# Patient Record
Sex: Male | Born: 1990 | Race: Black or African American | Hispanic: No | Marital: Single | Smoking: Current some day smoker
Health system: Southern US, Community
[De-identification: ages and names within clinical notes are randomized; demographics above are authoritative.]

## PROBLEM LIST (undated history)

## (undated) DIAGNOSIS — J45909 Unspecified asthma, uncomplicated: Secondary | ICD-10-CM

## (undated) DIAGNOSIS — F4310 Post-traumatic stress disorder, unspecified: Secondary | ICD-10-CM

## (undated) DIAGNOSIS — F209 Schizophrenia, unspecified: Secondary | ICD-10-CM

## (undated) DIAGNOSIS — R443 Hallucinations, unspecified: Secondary | ICD-10-CM

---
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
## 2011-12-18 ENCOUNTER — Encounter (HOSPITAL_COMMUNITY): Payer: Self-pay | Admitting: Emergency Medicine

## 2011-12-18 ENCOUNTER — Emergency Department (HOSPITAL_COMMUNITY)
Admission: EM | Admit: 2011-12-18 | Discharge: 2011-12-18 | Disposition: A | Discharge status: Home / Self Care | Payer: No Typology Code available for payment source | Attending: Emergency Medicine | Admitting: Emergency Medicine

## 2011-12-18 ENCOUNTER — Emergency Department (HOSPITAL_COMMUNITY): Payer: No Typology Code available for payment source

## 2011-12-18 DIAGNOSIS — T07XXXA Unspecified multiple injuries, initial encounter: Secondary | ICD-10-CM

## 2011-12-18 DIAGNOSIS — M549 Dorsalgia, unspecified: Secondary | ICD-10-CM | POA: Insufficient documentation

## 2011-12-18 DIAGNOSIS — M542 Cervicalgia: Secondary | ICD-10-CM | POA: Insufficient documentation

## 2011-12-18 LAB — DIFFERENTIAL
Band Neutrophils: 0 % (ref 0–10)
Blasts: 0 %
Eosinophils Absolute: 0.4 10*3/uL (ref 0.0–0.7)
Eosinophils Relative: 3 % (ref 0–5)
Lymphocytes Relative: 31 % (ref 12–46)
Lymphs Abs: 4 10*3/uL (ref 0.7–4.0)
Metamyelocytes Relative: 0 %
Monocytes Absolute: 0.4 10*3/uL (ref 0.1–1.0)
Monocytes Relative: 3 % (ref 3–12)
nRBC: 0 /100 WBC

## 2011-12-18 LAB — COMPREHENSIVE METABOLIC PANEL
Albumin: 4.5 g/dL (ref 3.5–5.2)
Alkaline Phosphatase: 73 U/L (ref 39–117)
BUN: 18 mg/dL (ref 6–23)
Calcium: 10.2 mg/dL (ref 8.4–10.5)
GFR calc Af Amer: >90 mL/min (ref >90)
Glucose, Bld: 89 mg/dL (ref 70–99)
Potassium: 3.2 mEq/L — ABNORMAL LOW (ref 3.5–5.1)
Total Protein: 8 g/dL (ref 6.0–8.3)

## 2011-12-18 LAB — CBC
HCT: 44.9 % (ref 39.0–52.0)
MCV: 85 fL (ref 78.0–100.0)
Platelets: 204 10*3/uL (ref 150–400)
RBC: 5.28 MIL/uL (ref 4.22–5.81)
RDW: 15.4 % (ref 11.5–15.5)
WBC: 13 10*3/uL — ABNORMAL HIGH (ref 4.0–10.5)

## 2011-12-18 MED ORDER — ONDANSETRON HCL 4 MG/2ML IJ SOLN
4.0000 mg | Freq: Once | INTRAMUSCULAR | Status: AC
  Administered 2011-12-18: 4 mg via INTRAVENOUS
  Filled 2011-12-18: qty 2

## 2011-12-18 MED ORDER — HYDROCODONE-ACETAMINOPHEN 5-500 MG PO TABS: 1.0000 | ORAL_TABLET | Freq: Four times a day (QID) | ORAL | Status: AC | PRN

## 2011-12-18 MED ORDER — SODIUM CHLORIDE 0.9 % IV SOLN
INTRAVENOUS | Status: AC | PRN
  Administered 2011-12-18: 500 mL/h via INTRAVENOUS

## 2011-12-18 MED ORDER — HYDROMORPHONE HCL PF 1 MG/ML IJ SOLN
1.0000 mg | Freq: Once | INTRAMUSCULAR | Status: AC
  Administered 2011-12-18: 1 mg via INTRAVENOUS
  Filled 2011-12-18: qty 1
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
## 2011-12-18 NOTE — ED Notes (Signed)
Drinks and snacks offered to pt, pt tolerated it well.
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
## 2011-12-18 NOTE — Discharge Instructions (Signed)
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
## 2016-08-31 ENCOUNTER — Emergency Department (HOSPITAL_COMMUNITY)
Admission: EM | Admit: 2016-08-31 | Discharge: 2016-09-03 | Disposition: A | Discharge status: Home / Self Care | Payer: Self-pay | Attending: Emergency Medicine | Admitting: Emergency Medicine

## 2016-08-31 ENCOUNTER — Encounter (HOSPITAL_COMMUNITY): Payer: Self-pay | Admitting: Emergency Medicine

## 2016-08-31 DIAGNOSIS — F29 Unspecified psychosis not due to a substance or known physiological condition: Secondary | ICD-10-CM | POA: Insufficient documentation

## 2016-08-31 DIAGNOSIS — Z79899 Other long term (current) drug therapy: Secondary | ICD-10-CM | POA: Insufficient documentation

## 2016-08-31 DIAGNOSIS — F17200 Nicotine dependence, unspecified, uncomplicated: Secondary | ICD-10-CM | POA: Insufficient documentation

## 2016-08-31 DIAGNOSIS — F203 Undifferentiated schizophrenia: Secondary | ICD-10-CM | POA: Diagnosis present

## 2016-08-31 HISTORY — DX: Post-traumatic stress disorder, unspecified: F43.10

## 2016-08-31 HISTORY — DX: Schizophrenia, unspecified: F20.9

## 2016-08-31 HISTORY — DX: Hallucinations, unspecified: R44.3

## 2016-08-31 LAB — CBC
HCT: 43.3 % (ref 39.0–52.0)
Hemoglobin: 14.8 g/dL (ref 13.0–17.0)
MCH: 28.6 pg (ref 26.0–34.0)
MCHC: 34.2 g/dL (ref 30.0–36.0)
MCV: 83.8 fL (ref 78.0–100.0)
PLATELETS: 207 10*3/uL (ref 150–400)
RBC: 5.17 MIL/uL (ref 4.22–5.81)
RDW: 14.2 % (ref 11.5–15.5)
WBC: 8.1 10*3/uL (ref 4.0–10.5)

## 2016-08-31 LAB — COMPREHENSIVE METABOLIC PANEL
ALT: 16 U/L — ABNORMAL LOW (ref 17–63)
ANION GAP: 8 (ref 5–15)
AST: 19 U/L (ref 15–41)
Albumin: 4.8 g/dL (ref 3.5–5.0)
Alkaline Phosphatase: 82 U/L (ref 38–126)
BILIRUBIN TOTAL: 0.5 mg/dL (ref 0.3–1.2)
BUN: 14 mg/dL (ref 6–20)
CALCIUM: 9.9 mg/dL (ref 8.9–10.3)
CO2: 26 mmol/L (ref 22–32)
Chloride: 104 mmol/L (ref 101–111)
Creatinine, Ser: 0.86 mg/dL (ref 0.61–1.24)
GFR calc Af Amer: >60 mL/min (ref >60)
Glucose, Bld: 132 mg/dL — ABNORMAL HIGH (ref 65–99)
Potassium: 3.9 mmol/L (ref 3.5–5.1)
Sodium: 138 mmol/L (ref 135–145)
TOTAL PROTEIN: 8.3 g/dL — AB (ref 6.5–8.1)

## 2016-08-31 LAB — RAPID URINE DRUG SCREEN, HOSP PERFORMED
Amphetamines: NOT DETECTED
BENZODIAZEPINES: NOT DETECTED
Barbiturates: NOT DETECTED
Cocaine: NOT DETECTED
Opiates: NOT DETECTED
Tetrahydrocannabinol: NOT DETECTED

## 2016-08-31 LAB — SALICYLATE LEVEL: Salicylate Lvl: <7 mg/dL (ref 2.8–30.0)

## 2016-08-31 LAB — ETHANOL

## 2016-08-31 LAB — ACETAMINOPHEN LEVEL

## 2016-08-31 MED ORDER — ACETAMINOPHEN 325 MG PO TABS: 650.0000 mg | ORAL_TABLET | ORAL | Status: DC | PRN

## 2016-08-31 MED ORDER — ZIPRASIDONE MESYLATE 20 MG IM SOLR: 20.0000 mg | Freq: Four times a day (QID) | INTRAMUSCULAR | Status: DC | PRN

## 2016-08-31 MED ORDER — ONDANSETRON 4 MG PO TBDP: 4.0000 mg | ORAL_TABLET | Freq: Three times a day (TID) | ORAL | Status: DC | PRN

## 2016-08-31 NOTE — ED Notes (Signed)
Vitals delayed due to patient being interrogated by [employer] at this time

## 2016-08-31 NOTE — ED Notes (Signed)
Bed: WA30 Expected date:  Expected time:  Means of arrival:  Comments: [employer]-IVC 

## 2016-08-31 NOTE — ED Notes (Signed)
Hourly rounding reveals patient in shower room. No complaints, stable, in no acute distress. Q15 minute rounds and monitoring via Security Cameras to continue.  

## 2016-08-31 NOTE — ED Provider Notes (Signed)
[facility]-EMERGENCY DEPT Provider Note   CSN: [id number] Arrival date & time: 08/31/16  1710     History   Chief Complaint Chief Complaint  Patient presents with  . IVC  . Delusional    HPI Curtis Davenport is a 26 y.o. male. Complaint is patient under IVC.  HPI:  26 year old male with history of schizophrenia and PTSD. Was at his counselors appointment today. Has been off his medications. Was delusional and psychotic. Displaced in her IVC by his counselor. Was brought here by [employer]. He was uncooperative. He had to be tazed there. This was without incident. He did not fall.  History of March are removed without difficulty.  Is not able to tell me much with history. He does not recall being his counselor's office. He states that [place] is [place]. He states that he takes medications to help him tolerate our atmosphere.  Past Medical History:  Diagnosis Date  . Hallucinations   . PTSD (post-traumatic stress disorder)   . Schizophrenia (HCC)     There are no active problems to display for this patient.   History reviewed. No pertinent surgical history.     Home Medications    Prior to Admission medications   Medication Sig Start Date End Date Taking? Authorizing Provider  albuterol (PROVENTIL HFA;VENTOLIN HFA) 108 (90 BASE) MCG/ACT inhaler Inhale 2 puffs into the lungs every 6 (six) hours as needed. For shortness of breath    Historical Provider, MD    Family History No family history on file.  Social History Social History  Substance Use Topics  . Smoking status: Current Some Day Smoker  . Smokeless tobacco: Never Used  . Alcohol use Yes     Allergies   Shellfish allergy   Review of Systems Review of Systems  Unable to perform ROS: Psychiatric disorder     Physical Exam Updated Vital Signs BP 139/81 (BP Location: Left Arm)   Pulse 103   Temp 99.6 F (37.6 C) (Oral)   Resp 22   SpO2 97%   Physical Exam  Constitutional: He is oriented to person,  place, and time. He appears well-developed and well-nourished. No distress.  HENT:  Head: Normocephalic.  Eyes: Conjunctivae are normal. Pupils are equal, round, and reactive to light. No scleral icterus.  Neck: Normal range of motion. Neck supple. No thyromegaly present.  Cardiovascular: Normal rate and regular rhythm.  Exam reveals no gallop and no friction rub.   No murmur heard. Pulmonary/Chest: Effort normal and breath sounds normal. No respiratory distress. He has no wheezes. He has no rales.  Abdominal: Soft. Bowel sounds are normal. He exhibits no distension. There is no tenderness. There is no rebound.  Musculoskeletal: Normal range of motion.  Neurological: He is alert and oriented to person, place, and time.  Skin: Skin is warm and dry. No rash noted.  Psychiatric: He has a normal mood and affect. His speech is tangential. He is hyperactive. Thought content is delusional.  Currently calm. Tells me that he is a "super hero and part of the  [employer], but "not really a super hero".     ED Treatments / Results  Labs (all labs ordered are listed, but only abnormal results are displayed) Labs Reviewed  COMPREHENSIVE METABOLIC PANEL - Abnormal; Notable for the following:       Result Value   Glucose, Bld 132 (*)    Total Protein 8.3 (*)    ALT 16 (*)    All other components  within normal limits  ACETAMINOPHEN LEVEL - Abnormal; Notable for the following:    Acetaminophen (Tylenol), Serum <10 (*)    All other components within normal limits  ETHANOL  SALICYLATE LEVEL  CBC  RAPID URINE DRUG SCREEN, HOSP PERFORMED    EKG  EKG Interpretation None       Radiology No results found.  Procedures Procedures (including critical care time)  Medications Ordered in ED Medications - No data to display   Initial Impression / Assessment and Plan / ED Course  I have reviewed the triage vital signs and the nursing notes.  Pertinent labs & imaging results that were  available during my care of the patient were reviewed by me and considered in my medical decision making (see chart for details).     Patient is not able to offer any insight into his condition. He is unable to talk about schizophrenia, medications. Her the importance of taking medications. When I ask him what he would do if he would leave he states he will "I guess just walk down the road". He IVC. I have written first exam. Patient is medically clear based on labs here. Awaiting TTS evaluation. Will likely require inpatient psychiatric care.  Final Clinical Impressions(s) / ED Diagnoses   Final diagnoses:  Psychosis, unspecified psychosis type    New Prescriptions New Prescriptions   No medications on file     Rolland Porter, MD 08/31/16 1943

## 2016-08-31 NOTE — ED Notes (Signed)
Requested PRN's from MD [facility]

## 2016-08-31 NOTE — ED Notes (Signed)
Pt asleep at this time in no distress.  Equal chest expansion with non-labored breathing.

## 2016-08-31 NOTE — ED Notes (Signed)
[facility] Expected date:  Expected time:  Means of arrival:  Comments: Hold for rm 30

## 2016-08-31 NOTE — ED Triage Notes (Signed)
Pt was picked up from his therapist's office after being IVC'd by her.  Pt reportedly has been noncompliant with his schizophrenia medications and believes he is part of the "[employer]" and thinks his therapist is part of the [employer].  States he thinks he is from another [place].  Pt was non-cooperative when picked up by [employer].

## 2016-08-31 NOTE — ED Notes (Signed)
Hourly rounding reveals patient sleeping in room. No complaints, stable, in no acute distress. Q15 minute rounds and monitoring via Security Cameras to continue. 

## 2016-08-31 NOTE — ED Notes (Signed)
Pt. Transferred to [facility] from ED to room 34 after screening for contraband. Report to include Situation, Background, Assessment and Recommendations from [facility]. Pt. Oriented to unit including Q15 minute rounds as well as the security cameras for their protection. Patient is alert and oriented, warm and dry in no acute distress. Patient denies SI, HI, and VH. Pt. States he hears voices without command. Pt. Encouraged to let me know if needs arise.
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
## 2016-09-01 ENCOUNTER — Encounter (HOSPITAL_COMMUNITY): Payer: Self-pay | Admitting: Registered Nurse

## 2016-09-01 DIAGNOSIS — F17210 Nicotine dependence, cigarettes, uncomplicated: Secondary | ICD-10-CM

## 2016-09-01 DIAGNOSIS — F29 Unspecified psychosis not due to a substance or known physiological condition: Secondary | ICD-10-CM

## 2016-09-01 DIAGNOSIS — Z91013 Allergy to seafood: Secondary | ICD-10-CM

## 2016-09-01 DIAGNOSIS — F203 Undifferentiated schizophrenia: Secondary | ICD-10-CM | POA: Diagnosis present

## 2016-09-01 DIAGNOSIS — Z79899 Other long term (current) drug therapy: Secondary | ICD-10-CM

## 2016-09-01 NOTE — ED Notes (Signed)
Hourly rounding reveals patient sleeping in room. No complaints, stable, in no acute distress. Q15 minute rounds and monitoring via Security Cameras to continue. 

## 2016-09-01 NOTE — ED Notes (Signed)
This pt appears to be responding to internal stimuli.  He is walking around and laughing out loud.  He also is religiously preoccupied .  He denies S/I and H/I.  15 minute checks and video monitoring continue.

## 2016-09-01 NOTE — ED Notes (Signed)
Report to include situation, background, assessment and recommendations from Edie RN. Patient sleeping, respirations regular and unlabored. Q15 minute rounds and security camera observation to continue.   

## 2016-09-01 NOTE — ED Notes (Signed)
Hourly rounding reveals patient in room. No complaints, stable, in no acute distress. Q15 minute rounds and monitoring via Security Cameras to continue. 

## 2016-09-01 NOTE — Consult Note (Signed)
[facility] Consult   Reason for Consult:  Psychosis Referring Physician:  EDP Patient Identification: Curtis Davenport MRN:  [id number] Principal Diagnosis: Schizophrenia [facility]) Diagnosis:   Patient Active Problem List   Diagnosis Date Noted  . Schizophrenia (Curtis Davenport) [F20.9]     Total Time spent with patient: 45 minutes  Subjective:   Curtis Davenport is a 26 y.o. male patient presented to [facility] under IVC by his therapist states that patient believes he is part of the [employer]  HPI:  Curtis Davenport 26 y.o. male patient seen by Dr. Louretta Shorten and this provider.  Chart reviewed 09/01/16.   On evaluation:  Curtis Davenport reports that he does not know why he is in the hospital.  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  Patient also denies psych history.     Past Psychiatric History: Schizophrenia   Risk to Self: Suicidal Ideation: No Suicidal Intent: No Is patient at risk for suicide?: No Suicidal Plan?: No Access to Means: No What has been your use of drugs/alcohol within the last 12 months?: UTA Triggers for Past Attempts: None known Intentional Self Injurious Behavior: None Risk to Others: Homicidal Ideation: No Thoughts of Harm to Others: No Current Homicidal Intent: No Current Homicidal Plan: No Access to Homicidal Means: No History of harm to others?: No Assessment of Violence: On admission Violent Behavior Description: per chart pt was uncooperative with [employer] and had to be tased  Does patient have access to weapons?:  (pt stated "I don't know") Criminal Charges Pending?:  (UTA) Does patient have a court date:  (UTA) Prior Inpatient Therapy: Prior Inpatient Therapy: Yes Prior Therapy Dates: unknown, per IVC pt has been inpt. pt denies  Prior Therapy Facilty/Provider(s): unknown Reason for Treatment: unknown Prior Outpatient Therapy: Prior Outpatient Therapy: Yes Prior Therapy  Dates: current  Prior Therapy Facilty/Provider(s): [facility] Reason for Treatment: schizophrenia Does patient have an ACCT team?: Unknown Does patient have Intensive In-House Services?  : Unknown Does patient have [facility] services? : Unknown Does patient have P4CC services?: Unknown  Past Medical History:  Past Medical History:  Diagnosis Date  . Hallucinations   . PTSD (post-traumatic stress disorder)   . Schizophrenia (Curtis Davenport)    History reviewed. No pertinent surgical history. Family History: History reviewed. No pertinent family history. Family Psychiatric  History: Denies Social History:  History  Alcohol Use  . Yes     History  Drug Use No    Social History   Social History  . Marital status: Single    Spouse name: N/A  . Number of children: N/A  . Years of education: N/A   Social History Main Topics  . Smoking status: Current Some Day Smoker  . Smokeless tobacco: Never Used  . Alcohol use Yes  . Drug use: No  . Sexual activity: Yes   Other Topics Concern  . None   Social History Narrative  . None   Additional Social History:    Allergies:   Allergies  Allergen Reactions  . Shellfish Allergy Swelling    Labs:  Results for orders placed or performed during the hospital encounter of 08/31/16 (from the past 48 hour(s))  Rapid urine drug screen (hospital performed)     Status: None   Collection Time: 08/31/16  5:30 PM  Result Value Ref Range   Opiates NONE DETECTED NONE DETECTED   Cocaine NONE DETECTED NONE DETECTED  Benzodiazepines NONE DETECTED NONE DETECTED   Amphetamines NONE DETECTED NONE DETECTED   Tetrahydrocannabinol NONE DETECTED NONE DETECTED   Barbiturates NONE DETECTED NONE DETECTED    Comment:        DRUG SCREEN FOR MEDICAL PURPOSES ONLY.  IF CONFIRMATION IS NEEDED FOR ANY PURPOSE, NOTIFY LAB WITHIN 5 DAYS.        LOWEST DETECTABLE LIMITS FOR URINE DRUG SCREEN Drug Class       Cutoff (ng/mL) Amphetamine      1000 Barbiturate       200 Benzodiazepine   124 Tricyclics       580 Opiates          300 Cocaine          300 THC              50   Comprehensive metabolic panel     Status: Abnormal   Collection Time: 08/31/16  5:31 PM  Result Value Ref Range   Sodium 138 135 - 145 mmol/L   Potassium 3.9 3.5 - 5.1 mmol/L   Chloride 104 101 - 111 mmol/L   CO2 26 22 - 32 mmol/L   Glucose, Bld 132 (H) 65 - 99 mg/dL   BUN 14 6 - 20 mg/dL   Creatinine, Ser 0.86 0.61 - 1.24 mg/dL   Calcium 9.9 8.9 - 10.3 mg/dL   Total Protein 8.3 (H) 6.5 - 8.1 g/dL   Albumin 4.8 3.5 - 5.0 g/dL   AST 19 15 - 41 U/L   ALT 16 (L) 17 - 63 U/L   Alkaline Phosphatase 82 38 - 126 U/L   Total Bilirubin 0.5 0.3 - 1.2 mg/dL   GFR calc non Af Amer >60 >60 mL/min   GFR calc Af Amer >60 >60 mL/min    Comment: (NOTE) The eGFR has been calculated using the CKD EPI equation. This calculation has not been validated in all clinical situations. eGFR's persistently <60 mL/min signify possible Chronic Kidney Disease.    Anion gap 8 5 - 15  Ethanol     Status: None   Collection Time: 08/31/16  5:31 PM  Result Value Ref Range   Alcohol, Ethyl (B) <5 <5 mg/dL    Comment:        LOWEST DETECTABLE LIMIT FOR SERUM ALCOHOL IS 5 mg/dL FOR MEDICAL PURPOSES ONLY   Salicylate level     Status: None   Collection Time: 08/31/16  5:31 PM  Result Value Ref Range   Salicylate Lvl <9.9 2.8 - 30.0 mg/dL  Acetaminophen level     Status: Abnormal   Collection Time: 08/31/16  5:31 PM  Result Value Ref Range   Acetaminophen (Tylenol), Serum <10 (L) 10 - 30 ug/mL    Comment:        THERAPEUTIC CONCENTRATIONS VARY SIGNIFICANTLY. A RANGE OF 10-30 ug/mL MAY BE AN EFFECTIVE CONCENTRATION FOR MANY PATIENTS. HOWEVER, SOME ARE BEST TREATED AT CONCENTRATIONS OUTSIDE THIS RANGE. ACETAMINOPHEN CONCENTRATIONS >150 ug/mL AT 4 HOURS AFTER INGESTION AND >50 ug/mL AT 12 HOURS AFTER INGESTION ARE OFTEN ASSOCIATED WITH TOXIC REACTIONS.   cbc     Status: None    Collection Time: 08/31/16  5:31 PM  Result Value Ref Range   WBC 8.1 4.0 - 10.5 K/uL   RBC 5.17 4.22 - 5.81 MIL/uL   Hemoglobin 14.8 13.0 - 17.0 g/dL   HCT 43.3 39.0 - 52.0 %   MCV 83.8 78.0 - 100.0 fL   MCH 28.6 26.0 - 34.0 pg  MCHC 34.2 30.0 - 36.0 g/dL   RDW 14.2 11.5 - 15.5 %   Platelets 207 150 - 400 K/uL    Current Facility-Administered Medications  Medication Dose Route Frequency Provider Last Rate Last Dose  . acetaminophen (TYLENOL) tablet 650 mg  650 mg Oral Q4H PRN Tanna Furry, MD      . ondansetron (ZOFRAN-ODT) disintegrating tablet 4 mg  4 mg Oral Q8H PRN Tanna Furry, MD      . ziprasidone (GEODON) injection 20 mg  20 mg Intramuscular Q6H PRN Tanna Furry, MD       Current Outpatient Prescriptions  Medication Sig Dispense Refill  . albuterol (PROVENTIL HFA;VENTOLIN HFA) 108 (90 BASE) MCG/ACT inhaler Inhale 2 puffs into the lungs every 6 (six) hours as needed. For shortness of breath      Musculoskeletal: Strength & Muscle Tone: within normal limits Gait & Station: normal Patient leans: N/A  Psychiatric Specialty Exam: Physical Exam  Nursing note and vitals reviewed. Constitutional: He is oriented to person, place, and time.  Neck: Normal range of motion.  Respiratory: Effort normal.  Musculoskeletal: Normal range of motion.  Neurological: He is alert and oriented to person, place, and time.    Review of Systems  Psychiatric/Behavioral: Positive for depression.    Blood pressure 121/71, pulse 60, temperature 98.8 F (37.1 C), temperature source Oral, resp. rate 18, SpO2 100 %.There is no height or weight on file to calculate BMI.  General Appearance: Disheveled and Odorous  Eye Contact:  Good  Speech:  Clear and Coherent and Normal Rate  Volume:  Normal  Mood:  Anxious  Affect:  Labile  Thought Process:  Disorganized  Orientation:  Full (Time, Place, and Person)  Thought Content:  Delusions  Suicidal Thoughts:  No  Homicidal Thoughts:  No  Memory:   Immediate;   Poor Recent;   Poor Remote;   Poor  Judgement:  Impaired  Insight:  Lacking  Psychomotor Activity:  Normal  Concentration:  Concentration: Poor and Attention Span: Poor  Recall:  Poor  Fund of Knowledge:  Fair  Language:  Good  Akathisia:  No  Handed:  Right  AIMS (if indicated):     Assets:  Communication Skills  ADL's:  Intact  Cognition:  WNL  Sleep:        Treatment Plan Summary: Daily contact with patient to assess and evaluate symptoms and progress in treatment and Medication management  Medication: Geodon 20 mg intramuscular every 6 hours when necessary - maximum doses 20 mg a day  Disposition: Recommend psychiatric Inpatient admission when medically cleared.  Rankin, Delphia Grates, NP 09/01/2016 5:41 PM   Patient seen with the physician next and for this evacuation and case discussed with the treatment team and formulated treatment plan. Reviewed the information documented and agree with the treatment plan.   [facility] 09/06/2016 6:55 PM

## 2016-09-01 NOTE — BHH Counselor (Signed)
Pt has been faxed to the following inpt facilities: [facility], [facility], [facility], [facility], [facility], [facility], [facility], [address], [facility].  Princess BruinsAquicha Duff, MSW, Theresia MajorsLCSWA

## 2016-09-01 NOTE — ED Notes (Signed)
Pt "politely refused" vital signs. RN notified.

## 2016-09-02 NOTE — Progress Notes (Signed)
CSW contacted by staff member Johnathan from [facility]. Staff inquired about patient's need for inpatient hospitalization. CSW informed staff that patient continues to need inpatient hospitalization. Staff reported that they would contact CSW after reviewing patient's referral.

## 2016-09-02 NOTE — ED Notes (Signed)
Hourly rounding reveals patient in hall. No complaints, stable, in no acute distress. Q15 minute rounds and monitoring via Security Cameras to continue. 

## 2016-09-02 NOTE — ED Notes (Signed)
Hourly rounding reveals patient sleeping in room. No complaints, stable, in no acute distress. Q15 minute rounds and monitoring via Security Cameras to continue. 

## 2016-09-02 NOTE — ED Notes (Signed)
Hourly rounding reveals patient in room. No complaints, stable, in no acute distress. Q15 minute rounds and monitoring via Security Cameras to continue. 

## 2016-09-02 NOTE — BH Assessment (Signed)
Patient was reassessed by TTS.   Patient was in his room in the bed. 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 Patient denies currently suicidal ideations. Patient denies homicidal ideations. Patient appears to have altered reality testing based on some answers to his questions. When asked about hallucinations patient states "not naturally, no, unless it's a different world but even then it's natural because Earth is the world and the Earth is natural." Patient denies questions or concerns at this time.   Consulted with Dr. Jama Flavorsobos who continues to recommend inpatient treatment at this time.   Davina PokeJoVea Jenisse Vullo, LCSW Therapeutic Triage Specialist [facility] 09/02/2016 2:44 PM

## 2016-09-02 NOTE — ED Notes (Signed)
Pt withdrawn, guarded, forwards little with this nurse. Pt denies SI/HI. Encouragement and support provided. Special checks q 15 mins in place for safety. Video monitoring in place. Will continue to monitor.

## 2016-09-02 NOTE — ED Notes (Signed)
Report to include Situation, Background, Assessment, and Recommendations received from Ashley RN. Patient alert and oriented, warm and dry, in no acute distress. Patient denies SI, HI, AVH and pain. Patient made aware of Q15 minute rounds and security cameras for their safety. Patient instructed to come to me with needs or concerns.  

## 2016-09-03 DIAGNOSIS — F203 Undifferentiated schizophrenia: Secondary | ICD-10-CM

## 2016-09-03 MED ORDER — ARIPIPRAZOLE 5 MG PO TABS
5.0000 mg | ORAL_TABLET | Freq: Every day | ORAL | Status: DC
  Administered 2016-09-03: 5 mg via ORAL
  Filled 2016-09-03: qty 1

## 2016-09-03 MED ORDER — ARIPIPRAZOLE 5 MG PO TABS: 5.0000 mg | ORAL_TABLET | Freq: Every day | ORAL | 0 refills | Status: DC

## 2016-09-03 NOTE — ED Notes (Signed)
Hourly rounding reveals patient sleeping in room. No complaints, stable, in no acute distress. Q15 minute rounds and monitoring via Security Cameras to continue. 

## 2016-09-03 NOTE — Care Management (Addendum)
ED CM met with ACT Team Peer Support person, Patient's prescriptions and   ffollow up will be managed  by ACT Care Management Team as per ACT team  

## 2016-09-03 NOTE — Discharge Instructions (Signed)
For your ongoing behavioral health needs, you are advised to continue treatment with the [facility], your current outpatient provider:       [facility]      [facility], [address]      [address]      [place], [place]  [address]      [phone]      [phone]

## 2016-09-03 NOTE — Care Management (Signed)
ED CM updated most recent phone number in record [phone]

## 2016-09-03 NOTE — ED Notes (Signed)
Hourly rounding reveals patient in room. Pt. Seems to be responding to internal stimuli. No complaints, stable, in no acute distress. Q15 minute rounds and monitoring via [employer] Cam[employer] to continue.

## 2016-09-03 NOTE — Consult Note (Signed)
[facility]   Reason for Consult:  Delusions  Referring Physician:  EDP Patient Identification: Curtis Davenport MRN:  [id number] Principal Diagnosis: Schizophrenia, undifferentiated (HCC) Diagnosis:   Patient Active Problem List   Diagnosis Date Noted  . Schizophrenia, undifferentiated (HCC) [F20.3]     Priority: High    Total Time spent with patient: 30 minutes  Subjective:   Curtis Rasheradon Reesor is a 26 y.o. male patient has stabilized.  HPI:  26 yo male  who presented to the ED with delusions and noncompliance of medication.  He was given Geodon and stabilized.  Denies hallucinations along with suicidal/homicidal ideations and drug/alcohol abuse.  He is agreeable to discharge, stable for discharge to his ACT team.  Past Psychiatric History: schizophrenia, undifferentiated  Risk to Self: Suicidal Ideation: No Suicidal Intent: No Is patient at risk for suicide?: No Suicidal Plan?: No Access to Means: No What has been your use of drugs/alcohol within the last 12 months?: UTA Triggers for Past Attempts: None known Intentional Self Injurious Behavior: None Risk to Others: None Prior Inpatient Therapy: Prior Inpatient Therapy: Yes Prior Therapy Dates: unknown, per IVC pt has been inpt. pt denies  Prior Therapy Facilty/Provider(s): unknown Reason for Treatment: unknown Prior Outpatient Therapy: Prior Outpatient Therapy: Yes Prior Therapy Dates: current  Prior Therapy [facility]ilty[facility]Provider([facility] Reason for Treatment: schizophrenia Does patient have an ACCT team?: Unknown Does patient have Intensive In-House Services?  : Unknown Does patient have [facility] services? : Unknown Does patient have P4CC services?: Unknown  Past Medical History:  Past Medical History:  Diagnosis Date  . Hallucinations   . PTSD (post-traumatic stress disorder)   . Schizophrenia (HCC)    History reviewed. No pertinent surgical history. Family History: History reviewed. No pertinent  family history. Family Psychiatric  History: none Social History:  History  Alcohol Use  . Yes     History  Drug Use No    Social History   Social History  . Marital status: Single    Spouse name: N/A  . Number of children: N/A  . Years of education: N/A   Social History Main Topics  . Smoking status: Current Some Day Smoker  . Smokeless tobacco: Never Used  . Alcohol use Yes  . Drug use: No  . Sexual activity: Yes   Other Topics Concern  . None   Social History Narrative  . None   Additional Social History:    Allergies:   Allergies  Allergen Reactions  . Shellfish Allergy Swelling    Labs: No results found for this or any previous visit (from the past 48 hour(s)).  Current Facility-Administered Medications  Medication Dose Route Frequency Provider Last Rate Last Dose  . acetaminophen (TYLENOL) tablet 650 mg  650 mg Oral Q4H PRN Rolland PorterMark James, MD      . ondansetron (ZOFRAN-ODT) disintegrating tablet 4 mg  4 mg Oral Q8H PRN Rolland PorterMark James, MD      . ziprasidone (GEODON) injection 20 mg  20 mg Intramuscular Q6H PRN Rolland PorterMark James, MD       Current Outpatient Prescriptions  Medication Sig Dispense Refill  . albuterol (PROVENTIL HFA;VENTOLIN HFA) 108 (90 BASE) MCG/ACT inhaler Inhale 2 puffs into the lungs every 6 (six) hours as needed. For shortness of breath      Musculoskeletal: Strength & Muscle Tone: within normal limits Gait & Station: normal Patient leans: N/A  Psychiatric Specialty Exam: Physical Exam  Constitutional: He is oriented to person, place, and time. He appears well-developed  and well-nourished.  HENT:  Head: Normocephalic.  Neck: Normal range of motion.  Respiratory: Effort normal.  Musculoskeletal: Normal range of motion.  Neurological: He is alert and oriented to person, place, and time.  Psychiatric: He has a normal mood and affect. His speech is normal and behavior is normal. Judgment and thought content normal. Cognition and memory are  normal.    Review of Systems  All other systems reviewed and are negative.   Blood pressure 130/66, pulse 65, temperature 97.5 F (36.4 C), temperature source Oral, resp. rate 18, SpO2 100 %.There is no height or weight on file to calculate BMI.  General Appearance: Casual  Eye Contact:  Good  Speech:  Normal Rate  Volume:  Normal  Mood:  Euthymic  Affect:  Congruent  Thought Process:  Coherent and Descriptions of Associations: Intact  Orientation:  Full (Time, Place, and Person)  Thought Content:  WDL and Logical  Suicidal Thoughts:  No  Homicidal Thoughts:  No  Memory:  Immediate;   Good Recent;   Good Remote;   Good  Judgement:  Good  Insight:  Good  Psychomotor Activity:  Normal  Concentration:  Concentration: Good and Attention Span: Good  Recall:  Good  Fund of Knowledge:  Fair  Language:  Good  Akathisia:  No  Handed:  Right  AIMS (if indicated):     Assets:  Leisure Time Physical Health Resilience Social Support  ADL's:  Intact  Cognition:  WNL  Sleep:        Treatment Plan Summary: Daily contact with patient to assess and evaluate symptoms and progress in treatment, Medication management and Plan schizophrenia, undifferentiated:  -Crisis stabilization -Medication management:  Geodon 20 mg twice, started Abilify 5 mg daily for psychosis -Individual counseling -ACT team coordination of care  Disposition: No evidence of imminent risk to self or others at present.    Nanine Means, NP 09/03/2016 10:44 AM  Patient seen face-to-face for psychiatric evaluation, chart reviewed and case discussed with the physician extender and developed treatment plan. Reviewed the information documented and agree with the treatment plan. [name], MD

## 2016-09-03 NOTE — ED Notes (Signed)
Pt discharged ambulatory with ACT team.  Discharge instructions and RX were reviewed.  All belongings were returned to patient.

## 2016-09-03 NOTE — BH Assessment (Signed)
BHH Assessment Progress Note  Per Thedore MinsMojeed Akintayo, MD, this pt does not require psychiatric hospitalization at this time.  Pt presents under IVC initiated his therapist, which Dr Jannifer FranklinAkintayo has rescinded.  [facility] to follow up with [facility], his current outpatient provider.  This has been included in pt's discharge instructions.  Sharman CrateLaurie Arena, NP, calls from [facility], and has been notified.  Pt's nurse, Kendal Hymendie, has been notified.  [name] Prosper Paff, MA Triage Specialist [phone]

## 2016-09-04 NOTE — BHH Suicide Risk Assessment (Signed)
Suicide Risk Assessment  Discharge Assessment   [facility]   Principal Problem: Schizophrenia, undifferentiated (HCC) Discharge Diagnoses:  Patient Active Problem List   Diagnosis Date Noted  . Schizophrenia, undifferentiated (HCC) [F20.3]     Priority: High    Total Time spent with patient: 30 minutes  Musculoskeletal: Strength & Muscle Tone: within normal limits Gait & Station: normal Patient leans: N/A  Psychiatric Specialty Exam: Physical Exam  Constitutional: He is oriented to person, place, and time. He appears well-developed and well-nourished.  HENT:  Head: Normocephalic.  Neck: Normal range of motion.  Respiratory: Effort normal.  Musculoskeletal: Normal range of motion.  Neurological: He is alert and oriented to person, place, and time.  Psychiatric: He has a normal mood and affect. His speech is normal and behavior is normal. Judgment and thought content normal. Cognition and memory are normal.    Review of Systems  All other systems reviewed and are negative.   Blood pressure 130/66, pulse 65, temperature 97.5 F (36.4 C), temperature source Oral, resp. rate 18, SpO2 100 %.There is no height or weight on file to calculate BMI.  General Appearance: Casual  Eye Contact:  Good  Speech:  Normal Rate  Volume:  Normal  Mood:  Euthymic  Affect:  Congruent  Thought Process:  Coherent and Descriptions of Associations: Intact  Orientation:  Full (Time, Place, and Person)  Thought Content:  WDL and Logical  Suicidal Thoughts:  No  Homicidal Thoughts:  No  Memory:  Immediate;   Good Recent;   Good Remote;   Good  Judgement:  Good  Insight:  Good  Psychomotor Activity:  Normal  Concentration:  Concentration: Good and Attention Span: Good  Recall:  Good  Fund of Knowledge:  Fair  Language:  Good  Akathisia:  No  Handed:  Right  AIMS (if indicated):     Assets:  Leisure Time Physical Health Resilience Social Support  ADL's:   Intact  Cognition:  WNL  Sleep:      Mental Status Per Nursing Assessment::   On Admission:   delusions with noncompliance of medications  Demographic Factors:  Male and Adolescent or young adult  Loss Factors: NA  Historical Factors: NA  Risk Reduction Factors:   Positive social support and Positive therapeutic relationship  Continued Clinical Symptoms:  None  Cognitive Features That Contribute To Risk:  None    Suicide Risk:  Minimal: No identifiable suicidal ideation.  Patients presenting with no risk factors but with morbid ruminations; may be classified as minimal risk based on the severity of the depressive symptoms    Plan Of Care/Follow-up recommendations:  Activity:  as tolerated  Diet:  heart healthy diet  LORD, JAMISON, NP 09/04/2016, 10:40 AM

## 2018-01-10 ENCOUNTER — Other Ambulatory Visit: Payer: Self-pay

## 2018-01-10 ENCOUNTER — Encounter (HOSPITAL_COMMUNITY): Payer: Self-pay | Admitting: *Deleted

## 2018-01-10 ENCOUNTER — Emergency Department (HOSPITAL_COMMUNITY)
Admission: EM | Admit: 2018-01-10 | Discharge: 2018-01-13 | Disposition: A | Discharge status: Other Acute Inpatient Hospital | Payer: Medicaid Other | Attending: Emergency Medicine | Admitting: Emergency Medicine

## 2018-01-10 DIAGNOSIS — F203 Undifferentiated schizophrenia: Secondary | ICD-10-CM | POA: Diagnosis present

## 2018-01-10 DIAGNOSIS — R440 Auditory hallucinations: Secondary | ICD-10-CM | POA: Insufficient documentation

## 2018-01-10 DIAGNOSIS — F17210 Nicotine dependence, cigarettes, uncomplicated: Secondary | ICD-10-CM | POA: Insufficient documentation

## 2018-01-10 DIAGNOSIS — F200 Paranoid schizophrenia: Secondary | ICD-10-CM | POA: Diagnosis present

## 2018-01-10 DIAGNOSIS — F29 Unspecified psychosis not due to a substance or known physiological condition: Secondary | ICD-10-CM | POA: Insufficient documentation

## 2018-01-10 LAB — ETHANOL: Alcohol, Ethyl (B): <10 mg/dL (ref <10)

## 2018-01-10 LAB — COMPREHENSIVE METABOLIC PANEL
ALT: 18 U/L (ref 17–63)
AST: 18 U/L (ref 15–41)
Albumin: 4.7 g/dL (ref 3.5–5.0)
Alkaline Phosphatase: 47 U/L (ref 38–126)
Anion gap: 8 (ref 5–15)
BUN: 9 mg/dL (ref 6–20)
CO2: 26 mmol/L (ref 22–32)
Calcium: 9.9 mg/dL (ref 8.9–10.3)
Chloride: 107 mmol/L (ref 101–111)
Creatinine, Ser: 0.86 mg/dL (ref 0.61–1.24)
GFR calc Af Amer: >60 mL/min (ref >60)
GFR calc non Af Amer: >60 mL/min (ref >60)
Glucose, Bld: 99 mg/dL (ref 65–99)
Potassium: 3.9 mmol/L (ref 3.5–5.1)
Sodium: 141 mmol/L (ref 135–145)
Total Bilirubin: 1.1 mg/dL (ref 0.3–1.2)
Total Protein: 8 g/dL (ref 6.5–8.1)

## 2018-01-10 LAB — CBC WITH DIFFERENTIAL/PLATELET
Basophils Absolute: 0 10*3/uL (ref 0.0–0.1)
Basophils Relative: 0 %
Eosinophils Absolute: 0.3 10*3/uL (ref 0.0–0.7)
Eosinophils Relative: 5 %
HCT: 47 % (ref 39.0–52.0)
Hemoglobin: 16 g/dL (ref 13.0–17.0)
Lymphocytes Relative: 34 %
Lymphs Abs: 2 10*3/uL (ref 0.7–4.0)
MCH: 29.7 pg (ref 26.0–34.0)
MCHC: 34 g/dL (ref 30.0–36.0)
MCV: 87.4 fL (ref 78.0–100.0)
Monocytes Absolute: 0.5 10*3/uL (ref 0.1–1.0)
Monocytes Relative: 9 %
Neutro Abs: 3.1 10*3/uL (ref 1.7–7.7)
Neutrophils Relative %: 52 %
Platelets: 197 10*3/uL (ref 150–400)
RBC: 5.38 MIL/uL (ref 4.22–5.81)
RDW: 14.8 % (ref 11.5–15.5)
WBC: 5.8 10*3/uL (ref 4.0–10.5)

## 2018-01-10 LAB — RAPID URINE DRUG SCREEN, HOSP PERFORMED
Amphetamines: NOT DETECTED
Benzodiazepines: NOT DETECTED
Cocaine: NOT DETECTED
Opiates: NOT DETECTED
Tetrahydrocannabinol: NOT DETECTED

## 2018-01-10 MED ORDER — ASENAPINE MALEATE 5 MG SL SUBL: 10.0000 mg | SUBLINGUAL_TABLET | Freq: Two times a day (BID) | SUBLINGUAL | Status: DC | PRN

## 2018-01-10 MED ORDER — ALBUTEROL SULFATE HFA 108 (90 BASE) MCG/ACT IN AERS: 2.0000 | INHALATION_SPRAY | Freq: Four times a day (QID) | RESPIRATORY_TRACT | Status: DC | PRN

## 2018-01-10 MED ORDER — RISPERIDONE 0.5 MG PO TABS
0.5000 mg | ORAL_TABLET | Freq: Two times a day (BID) | ORAL | Status: DC
  Administered 2018-01-10 – 2018-01-13 (×7): 0.5 mg via ORAL
  Filled 2018-01-10 (×7): qty 1

## 2018-01-10 MED ORDER — TRAZODONE HCL 100 MG PO TABS
100.0000 mg | ORAL_TABLET | Freq: Every day | ORAL | Status: DC
  Administered 2018-01-10 – 2018-01-12 (×3): 100 mg via ORAL
  Filled 2018-01-10 (×3): qty 1

## 2018-01-10 MED ORDER — HYDROXYZINE HCL 25 MG PO TABS: 25.0000 mg | ORAL_TABLET | Freq: Three times a day (TID) | ORAL | Status: DC | PRN

## 2018-01-10 MED ORDER — BENZTROPINE MESYLATE 0.5 MG PO TABS
0.5000 mg | ORAL_TABLET | Freq: Two times a day (BID) | ORAL | Status: DC
  Administered 2018-01-10 – 2018-01-13 (×7): 0.5 mg via ORAL
  Filled 2018-01-10 (×7): qty 1

## 2018-01-10 NOTE — ED Provider Notes (Signed)
[facility]-EMERGENCY DEPT Provider Note   CSN: [id number] Arrival date & time: 01/10/18  4098     History   Chief Complaint Chief Complaint  Patient presents with  . Medical Clearance    HPI Curtis Davenport is a 27 y.o. male.  HPI   27yM brought in by mother for psych evaluation. He has a past history of schizophrenia and PTSD. Behavior has been progressively worsening since his brother was murdered this past October. Now to the point where he is neglecting personal hygiene. He is scratching his neck and forearms to the point of excoriation. Having auditory and visual hallucinations. Will sometimes just go outside and stare at the sky for long periods of time. Today his mother tried taking [facility] to [facility] but had to leave because he banged a glass seemingly out of anger. He then walked out and he was "just going to kill myself." She doesn't know what medications he is on but she thinks he has been taking them.   Pt tells me he is here because he wants to know if he has "implantable technology" in his eyes or his brain. He says he can hear the device in his head shrieking over and over.  He says he feels fine because he can see the "blueprint of the environment."  Past Medical History:  Diagnosis Date  . Hallucinations   . PTSD (post-traumatic stress disorder)   . Schizophrenia [facility])     Patient Active Problem List   Diagnosis Date Noted  . Schizophrenia, undifferentiated (HCC)     History reviewed. No pertinent surgical history.      Home Medications    Prior to Admission medications   Medication Sig Start Date End Date Taking? Authorizing Provider  albuterol (PROVENTIL HFA;VENTOLIN HFA) 108 (90 BASE) MCG/ACT inhaler Inhale 2 puffs into the lungs every 6 (six) hours as needed. For shortness of breath    [provider]  ARIPiprazole (ABILIFY) 5 MG tablet Take 1 tablet (5 mg total) by mouth daily. 09/03/16   Charm Rings, NP    Family History History reviewed. No pertinent family history.  Social History Social History   Tobacco Use  . Smoking status: Current Some Day Smoker    Packs/day: 0.50  . Smokeless tobacco: Never Used  Substance Use Topics  . Alcohol use: Yes  . Drug use: No     Allergies   Shellfish allergy   Review of Systems Review of Systems  Level 5 caveat because of psychosis.  Physical Exam Updated Vital Signs BP 133/79 (BP Location: Right Arm)   Pulse (!) 56   Temp 97.6 F (36.4 C) (Oral)   Resp 18   Ht 5\' 11"  (1.803 m)   Wt 99.8 kg (220 lb)   SpO2 100%   BMI 30.68 kg/m   Physical Exam  Constitutional: He appears well-developed and well-nourished. No distress.  HENT:  Head: Normocephalic and atraumatic.  Eyes: Conjunctivae are normal. Right eye exhibits no discharge. Left eye exhibits no discharge.  Neck: Neck supple.  Cardiovascular: Normal rate, regular rhythm and normal heart sounds. Exam reveals no gallop and no friction rub.  No murmur heard. Pulmonary/Chest: Effort normal and breath sounds normal. No respiratory distress.  Abdominal: Soft. He exhibits no distension. There is no tenderness.  Musculoskeletal: He exhibits no edema or tenderness.  Neurological: He is alert.  Skin: Skin is warm and dry.  Superficial abrasions/hypertrophic skin changes to posterior neck just below  hairline and forearms. No cellulitis.   Psychiatric:  Flat affect. Appears to be responding to internal stimuli. Thought process is illogical. Generally calm and cooperative with me.   Nursing note and vitals reviewed.    ED Treatments / Results  Labs (all labs ordered are listed, but only abnormal results are displayed) Labs Reviewed  COMPREHENSIVE METABOLIC PANEL  ETHANOL  RAPID URINE DRUG SCREEN, HOSP PERFORMED  CBC WITH DIFFERENTIAL/PLATELET    EKG None  Radiology No results found.  Procedures Procedures (including critical care time)  Medications Ordered  in ED Medications - No data to display   Initial Impression / Assessment and Plan / ED Course  I have reviewed the triage vital signs and the nursing notes.  Pertinent labs & imaging results that were available during my care of the patient were reviewed by me and considered in my medical decision making (see chart for details).     27yM with bizarre thoughts and both auditory and visual hallucinations. Escalating behavior. Will medically clear for psych evaluation.   Final Clinical Impressions(s) / ED Diagnoses   Final diagnoses:  Psychosis, unspecified psychosis type [facility](HCC)    ED Discharge Orders    None       Raeford RazorKohut, Kiarrah Rausch, MD 01/10/18 1030

## 2018-01-10 NOTE — ED Notes (Signed)
Kenney Housemananya Ahnor-mother [phone]- cell phone

## 2018-01-10 NOTE — ED Triage Notes (Signed)
Patient brought to hospital by mother.  [facility] [place]  Patient has hx of schizo and pyschosis.  Patient was upset and verbally aggressive.  Patient went outside and sts "im just gonna kill myself"  Patients brother was killed in October of last year and he hasn't been the same since.  Patient lives with mother and sister, but shes concerned the last two days seeming "out of it and banging on walls"  Mom sts hes been confused off and on and this concerns her, she sts he has delusions.  Patients  Mom sts he hears voices and sees tattoos on his arms, but there is no tattoo there.

## 2018-01-10 NOTE — ED Notes (Signed)
Patient has been respectful, calm & cooperative since arrival to hospital.  Patient has eaten lunch and taken his medications.

## 2018-01-10 NOTE — BH Assessment (Addendum)
[facility] Assessment Progress Note  Per Thedore MinsMojeed Akintayo, MD, this pt requires psychiatric hospitalization at this time.  Dr Jannifer FranklinAkintayo also finds that pt meets criteria for IVC, which he has initiated.  [place] [facility], and at 13:19 Hart CarwinMagistrate Haynes confirms receipt.  She has since faxed Findings and Custody Order to this writer, and [facility] of Service.  The following facilities have been contacted to seek placement for this pt, with results as noted:  Beds available, information sent, decision pending:  [facility]   At capacity:  [facility]   [name] Bricelyn Freestone, [place] Behavioral Health Coordinator [phone]

## 2018-01-10 NOTE — ED Notes (Signed)
Bed: WBH37 Expected date:  Expected time:  Means of arrival:  Comments: Hold for 26 

## 2018-01-10 NOTE — BH Assessment (Addendum)
Assessment Note  Curtis Davenport is an 27 y.o. male that presents this date with AH, S/I and H/I. [facility] this date for a evaluation and became agitated. Patient per notes, was observed to be "banging on the glass" in the reception area and stating he was going to harm himself. Patient was [facility] where a IVC was initiated. Patient renders limited history during assessment and seems to be responding to internal stimuli as evidenced by patient speaking incoherently with his head under the covers and laughing. Patient is unable to participate in the assessment and answers "yes" and "no" to questions admitting to S/I and H/I although will not elaborate on plan or intent. Patient keeps his head covered during assessment and will not participate in the assessment process. Per note review patient has been receiving services from [facility] although this cannot be verified at this time. Information to complete assessment was obtained from admission notes and prior history. Per notes patient has a past history of schizophrenia and PTSD. Patient's behavior has been progressively worsening since his brother was murdered this past October. Patient is noted to be neglecting his personal hygiene. Per notes, patient has been scratching his neck and forearms to the point of excoriation. Patient is observed to be having auditory and visual hallucinations. Today his mother tried taking him to [facility] but had to leave because he banged a glass seemingly out of anger. He then walked out and he was "just going to kill myself." She doesn't know what medications he is on but she thinks he has been taking them. Patient's behavior has been progressively worsening since his brother was murdered this past October. Patient informs staff that he is here because he wants to know if he has "implantable technology" in his eyes or his brain. He says he can hear the  device in his head shrieking over and over. He says he feels fine because he can see the "blueprint of the environment." Case was staffed with Jannifer Franklin MD, [facility] Care FNP who recommended patient be admitted inpatient to assist with stabilization.   Diagnosis: F20.0 Schizophrenia (per notes)   Past Medical History:  Past Medical History:  Diagnosis Date  . Hallucinations   . PTSD (post-traumatic stress disorder)   . Schizophrenia (HCC)     History reviewed. No pertinent surgical history.  Family History: History reviewed. No pertinent family history.  Social History:  reports that he has been smoking.  He has been smoking about 0.50 packs per day. He has never used smokeless tobacco. He reports that he drinks alcohol. He reports that he does not use drugs.  Additional Social History:  Alcohol / Drug Use Pain Medications: See PTA meds  Prescriptions: See PTA meds  Over the Counter: See PTA meds  History of alcohol / drug use?: No history of alcohol / drug abuse(Pt denies) Longest period of sobriety (when/how long): NA Negative Consequences of Use: (NA) Withdrawal Symptoms: (NA)  CIWA: CIWA-Ar BP: 133/79 Pulse Rate: (!) 56 COWS:    Allergies:  Allergies  Allergen Reactions  . Shellfish Allergy Hives and Swelling    Home Medications:  (Not in a hospital admission)  OB/GYN Status:  No LMP for male patient.  General Assessment Data [facility] of [facility]: [facility] TTS Assessment: In system Is this a Tele or Face-to-Face Assessment?: Face-to-Face Is this an Initial Assessment or a Re-assessment for this encounter?: Initial Assessment Marital status: Single Maiden name:  NA Is patient pregnant?: No Pregnancy Status: No Living Arrangements: Alone Can pt return to current living arrangement?: Yes Admission Status: Involuntary Is patient capable of signing voluntary admission?: Yes Referral Source: Other(Provider) Insurance type: Self Pay  Medical Screening Exam [facility] Walk-in  ONLY) Medical Exam completed: Yes  Crisis Care Plan Living Arrangements: Alone Legal Guardian: (NA) Name of Psychiatrist: [facility] Name of Therapist: None  Education Status Is patient currently in school?: No Is the patient employed, unemployed or receiving disability?: Unemployed  Risk to self with the past 6 months Suicidal Ideation: Yes-Currently Present Has patient been a risk to self within the past 6 months prior to admission? : Yes Suicidal Intent: No Has patient had any suicidal intent within the past 6 months prior to admission? : No Is patient at risk for suicide?: Yes Suicidal Plan?: No Has patient had any suicidal plan within the past 6 months prior to admission? : No Access to Means: No What has been your use of drugs/alcohol within the last 12 months?: Denies  Previous Attempts/Gestures: No How many times?: 0 Other Self Harm Risks: NA Triggers for Past Attempts: Unknown Intentional Self Injurious Behavior: Bruising(Picking his skin) Comment - Self Injurious Behavior: Scratching  Family Suicide History: No Recent stressful life event(s): Loss (Comment)(Loss of family member) Persecutory voices/beliefs?: No Depression: (UTA) Depression Symptoms: (UTA) Substance abuse history and/or treatment for substance abuse?: No Suicide prevention information given to non-admitted patients: Not applicable  Risk to Others within the past 6 months Homicidal Ideation: Yes-Currently Present Does patient have any lifetime risk of violence toward others beyond the six months prior to admission? : (UTA) Thoughts of Harm to Others: (UTA) Current Homicidal Intent: (UTA) Current Homicidal Plan: (UTA) Access to Homicidal Means: (UTA) Identified Victim: (UTA) History of harm to others?: (UTA) Assessment of Violence: On admission Violent Behavior Description: Pt became agitated at MD's office Does patient have access to weapons?: (UTA) Criminal Charges Pending?: No Does patient  have a court date: No Is patient on probation?: Unknown  Psychosis Hallucinations: Auditory, Visual Delusions: None noted  Mental Status Report Appearance/Hygiene: In scrubs Eye Contact: Poor Motor Activity: Agitation Speech: Soft, Slow Level of Consciousness: Drowsy Mood: Irritable Affect: Angry Anxiety Level: Moderate Thought Processes: Thought Blocking Judgement: Unable to Assess Orientation: Unable to assess Obsessive Compulsive Thoughts/Behaviors: None  Cognitive Functioning Concentration: Unable to Assess Memory: Unable to Assess Is patient IDD: No Is patient DD?: No Insight: Unable to Assess Impulse Control: Unable to Assess Appetite: (UTA) Have you had any weight changes? : (UTA) Sleep: (UTA) Total Hours of Sleep: (UTA) Vegetative Symptoms: (UTA)  ADLScreening [facility] Assessment Services) Patient's cognitive ability adequate to safely complete daily activities?: Yes Patient able to express need for assistance with ADLs?: Yes Independently performs ADLs?: Yes (appropriate for developmental age)  Prior Inpatient Therapy Prior Inpatient Therapy: Yes Prior Therapy Dates: 2015 [facility] Reason for Treatment: MH issues  Prior Outpatient Therapy Prior Outpatient Therapy: Yes Prior Therapy Dates: Ongoing Prior Therapy Facilty/Provider(s): [facility] notes) Reason for Treatment: MH issues Does patient have an ACCT team?: No Does patient have Intensive In-House Services?  : No Does patient have [facility] services? : No Does patient have P4CC services?: No  ADL Screening (condition at time of admission) Patient's cognitive ability adequate to safely complete daily activities?: Yes Is the patient deaf or have difficulty hearing?: No Does the patient have difficulty seeing, even when wearing glasses/contacts?: No Does the patient have difficulty concentrating, remembering, or making decisions?:  No Patient able to express need for  assistance with ADLs?: Yes Does the patient have difficulty dressing or bathing?: No Independently performs ADLs?: Yes (appropriate for developmental age) Does the patient have difficulty walking or climbing stairs?: No Weakness of Legs: None Weakness of Arms/Hands: None  Home Assistive Devices/Equipment Home Assistive Devices/Equipment: None  Therapy Consults (therapy consults require a physician order) PT Evaluation Needed: No OT Evalulation Needed: No SLP Evaluation Needed: No Abuse/Neglect Assessment (Assessment to be complete while patient is alone) Physical Abuse: Denies Verbal Abuse: Denies Exploitation of patient/patient's resources: Denies Self-Neglect: Denies Values / Beliefs Cultural Requests During Hospitalization: None Spiritual Requests During Hospitalization: None Consults Spiritual Care Consult Needed: No Social Work Consult Needed: No Merchant navy officer (For Healthcare) Does Patient Have a Medical Advance Directive?: No Would patient like information on creating a medical advance directive?: No - Patient declined    Additional Information 1:1 In Past 12 Months?: No CIRT Risk: Yes Elopement Risk: Yes Does patient have medical clearance?: Yes     Disposition: Case was staffed with Jannifer Franklin MD, Arville Care FNP who recommended patient be admitted inpatient to assist with stabilization.    Disposition Initial Assessment Completed for this Encounter: Yes Disposition of Patient: Admit Type of inpatient treatment program: Adult Patient refused recommended treatment: Yes  On Site Evaluation by:   Reviewed with Physician:    Alfredia Ferguson 01/10/2018 12:40 PM

## 2018-01-10 NOTE — Progress Notes (Addendum)
CSW received a call from [facility] who requested pt's need for a bed.  CSW confirmed pt still is needing placement.  [facility] states they will review pt's case further and will call back.  CSW awaiting return call from [facility].  CSW will continue to follow for D/C needs.  Rees Matura F. Jaikob Borgwardt, LCSW, LCAS, CSI Clinical Social Worker Ph: [phone]   

## 2018-01-10 NOTE — ED Notes (Signed)
Pt sleeping at present, no distress noted, remains SI.  Monitoring for safety, Q 15 min checks in effect. 

## 2018-01-10 NOTE — ED Notes (Signed)
On admission to the Acute Unit pt is calm and cooperative, but guarded. He would like to leave, but accepts that he will spend the night.

## 2018-01-10 NOTE — ED Notes (Signed)
ED Provider at bedside. Kohut 

## 2018-01-10 NOTE — BH Assessment (Signed)
[facility] Assessment Progress Note Case was staffed with Jannifer FranklinAkintayo MD, Arville CareParks FNP who recommended patient be admitted inpatient to assist with stabilization.

## 2018-01-10 NOTE — ED Notes (Signed)
Patient has not been taking his medications daily, takes them infrequently

## 2018-01-11 NOTE — ED Notes (Signed)
Patient taking a shower.

## 2018-01-11 NOTE — Progress Notes (Signed)
Patient [facility] criteria [facility] inpatient treatment[facility].  CSW faxed referrals to the following facilities for review.  TTS/CSW's/Disposition will continue to seek bed placement.    [facility]      [facility]     [facility]     [facility]     [facility]     [facility]     [facility]     [facility]     [facility]     [facility]     [facility]     [facility]     [facility]     [facility]     [facility]     [facility]     [facility]     [facility]     [facility]     [facility]     [facility]     [facility]     [facility]     [facility]     [facility]     [facility]     [facility]     [facility]     [facility]     [facility]     [facility]     [facility]     Please reconsult if future social work needs arise.  CSW signing off, as social work intervention is no longer needed.  Dorothe PeaJonathan F. Henrry Feil, LCSW, LCAS, CSI Clinical Social Worker Ph: [phone]

## 2018-01-11 NOTE — ED Notes (Signed)
Patient took po medications without difficulty.  Calm and cooperative.

## 2018-01-11 NOTE — Consult Note (Addendum)
[place] Psychiatry Consult   Reason for Consult:  Agitation and paranoia Referring Physician:  EDP Patient Identification: Curtis Davenport MRN:  [id number] Principal Diagnosis: Paranoid schizophrenia ([place]) Diagnosis:   Patient Active Problem List   Diagnosis Date Noted  . Paranoid schizophrenia ([place]) [F20.0] 01/10/2018  . 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 male patient admitted with .  HPI:  Pt was seen and chart reviewed with treatment team and Dr Darleene Cleaver. Pt denies suicidal/homicidal ideation, denies auditory/visual hallucinations. Pt   does appear to be responding to internal stimuli and is paranoid. Pt was place under IVC after arriving at [facility] for becoming agitated when he went to his appointment at [facility]. Pt was verbalizing suicidal ideation upon arrival at the ED but today did not voice this during assessment. Pt is not taking care of his personal hygiene. Pt would benefit from an inpatient psychiatric admission for medication management and crisis stabilization.   Past Psychiatric History: As above  Risk to Self: Suicidal Ideation: Yes-Currently Present Suicidal Intent: No Is patient at risk for suicide?: Yes Suicidal Plan?: No Access to Means: No What has been your use of drugs/alcohol within the last 12 months?: Denies  How many times?: 0 Other Self Harm Risks: NA Triggers for Past Attempts: Unknown Intentional Self Injurious Behavior: Bruising(Picking his skin) Comment - Self Injurious Behavior: Scratching  Risk to Others: Homicidal Ideation: Yes-Currently Present Thoughts of Harm to Others: (UTA) Current Homicidal Intent: (UTA) Current Homicidal Plan: (UTA) Access to Homicidal Means: (UTA) Identified Victim: (UTA) History of harm to others?: (UTA) Assessment of Violence: On admission Violent Behavior Description: Pt became agitated at MD's  office Does patient have access to weapons?: ([place]) Criminal Charges Pending?: No Does patient have a court date: No Prior Inpatient Therapy: Prior Inpatient Therapy: Yes Prior Therapy Dates: 2015 Prior Therapy Facilty/Provider(s): [facility] Reason for Treatment: MH issues Prior Outpatient Therapy: Prior Outpatient Therapy: Yes Prior Therapy Dates: Ongoing Prior Therapy Facilty/Provider(s): [facility](per notes) Reason for Treatment: MH issues Does patient have an ACCT team?: No Does patient have Intensive In-House Services?  : No Does patient have [facility] services? : No Does patient have P4CC services?: No  Past Medical History:  Past Medical History:  Diagnosis Date  . Hallucinations   . PTSD (post-traumatic stress disorder)   . Schizophrenia ([place])    History reviewed. No pertinent surgical history. Family History: History reviewed. No pertinent family history. Family Psychiatric  History: Unknown Social History:  Social History   Substance and Sexual Activity  Alcohol Use Yes     Social History   Substance and Sexual Activity  Drug Use No    Social History   Socioeconomic History  . Marital status: Single    Spouse name: Not on file  . Number of children: Not on file  . Years of education: Not on file  . Highest education level: Not on file  Occupational History  . Not on file  Social Needs  . Financial resource strain: Not on file  . Food insecurity:    Worry: Not on file    Inability: Not on file  . Transportation needs:    Medical: Not on file    Non-medical: Not on file  Tobacco Use  . Smoking status: Current Some Day Smoker    Packs/day: 0.50  . Smokeless tobacco: Never Used  Substance and Sexual Activity  .  Alcohol use: Yes  . Drug use: No  . Sexual activity: Yes  Lifestyle  . Physical activity:    Days per week: Not on file    Minutes per session: Not on file  . Stress: Not on file  Relationships  . Social connections:    Talks on phone: Not on  file    Gets together: Not on file    Attends religious service: Not on file    Active member of club or organization: Not on file    Attends meetings of clubs or organizations: Not on file    Relationship status: Not on file  Other Topics Concern  . Not on file  Social History Narrative  . Not on file   Additional Social History:    Allergies:   Allergies  Allergen Reactions  . Shellfish Allergy Hives and Swelling    Labs:  Results for orders placed or performed during the hospital encounter of 01/10/18 (from the past 48 hour(s))  Urine rapid drug screen (hosp performed)     Status: Abnormal   Collection Time: 01/10/18 10:20 AM  Result Value Ref Range   Opiates NONE DETECTED NONE DETECTED   Cocaine NONE DETECTED NONE DETECTED   Benzodiazepines NONE DETECTED NONE DETECTED   Amphetamines NONE DETECTED NONE DETECTED   Tetrahydrocannabinol NONE DETECTED NONE DETECTED   Barbiturates (A) NONE DETECTED    Result not available. Reagent lot number recalled by manufacturer.    Comment: Performed at [facility], [place] [address] [place], [place] [address]  Comprehensive metabolic panel     Status: None   Collection Time: 01/10/18 10:57 AM  Result Value Ref Range   Sodium 141 135 - 145 mmol/L   Potassium 3.9 3.5 - 5.1 mmol/L   Chloride 107 101 - 111 mmol/L   CO2 26 22 - 32 mmol/L   Glucose, Bld 99 65 - 99 mg/dL   BUN 9 6 - 20 mg/dL   Creatinine, Ser 0.86 0.61 - 1.24 mg/dL   Calcium 9.9 8.9 - 10.3 mg/dL   Total Protein 8.0 6.5 - 8.1 g/dL   Albumin 4.7 3.5 - 5.0 g/dL   AST 18 15 - 41 U/L   ALT 18 17 - 63 U/L   Alkaline Phosphatase 47 38 - 126 U/L   Total Bilirubin 1.1 0.3 - 1.2 mg/dL   GFR calc non Af Amer >60 >60 mL/min   GFR calc Af Amer >60 >60 mL/min    Comment: (NOTE) The eGFR has been calculated using the CKD EPI equation. This calculation has not been validated in all clinical situations. eGFR's persistently <60 mL/min signify possible Chronic  Kidney Disease.    Anion gap 8 5 - 15    Comment: Performed at [facility], [place] [address], [place], [place] [address]  Ethanol     Status: None   Collection Time: 01/10/18 10:57 AM  Result Value Ref Range   Alcohol, Ethyl (B) <10 <10 mg/dL    Comment: (NOTE) Lowest detectable limit for serum alcohol is 10 mg/dL. For medical purposes only. Performed at [facility], [place] [address] [place], [place] [address]   CBC with Diff     Status: None   Collection Time: 01/10/18 10:57 AM  Result Value Ref Range   WBC 5.8 4.0 - 10.5 K/uL   RBC 5.38 4.22 - 5.81 MIL/uL   Hemoglobin 16.0 13.0 - 17.0 g/dL   HCT 47.0 39.0 - 52.0 %  MCV 87.4 78.0 - 100.0 fL   MCH 29.7 26.0 - 34.0 pg   MCHC 34.0 30.0 - 36.0 g/dL   RDW 14.8 11.5 - 15.5 %   Platelets 197 150 - 400 K/uL   Neutrophils Relative % 52 %   Neutro Abs 3.1 1.7 - 7.7 K/uL   Lymphocytes Relative 34 %   Lymphs Abs 2.0 0.7 - 4.0 K/uL   Monocytes Relative 9 %   Monocytes Absolute 0.5 0.1 - 1.0 K/uL   Eosinophils Relative 5 %   Eosinophils Absolute 0.3 0.0 - 0.7 K/uL   Basophils Relative 0 %   Basophils Absolute 0.0 0.0 - 0.1 K/uL    Comment: Performed at [facility], [place] [address] [place], [place] [address]    Current Facility-Administered Medications  Medication Dose Route Frequency Provider Last Rate Last Dose  . albuterol (PROVENTIL HFA;VENTOLIN HFA) 108 (90 Base) MCG/ACT inhaler 2 puff  2 puff Inhalation Q6H PRN Shanterria Franta, MD      . asenapine (SAPHRIS) sublingual tablet 10 mg  10 mg Sublingual BID PRN Tremeka Helbling, MD      . benztropine (COGENTIN) tablet 0.5 mg  0.5 mg Oral BID Nechuma Boven, MD   0.5 mg at 01/11/18 1028  . hydrOXYzine (ATARAX/VISTARIL) tablet 25 mg  25 mg Oral TID PRN Jacob Chamblee, MD      . risperiDONE (RISPERDAL) tablet 0.5 mg  0.5 mg Oral BID Wendal Wilkie, MD   0.5 mg at 01/11/18 1028  . traZODone (DESYREL) tablet 100 mg  100 mg  Oral QHS Johndaniel Catlin, MD   100 mg at 01/10/18 2117   Current Outpatient Medications  Medication Sig Dispense Refill  . ARIPiprazole (ABILIFY) 5 MG tablet Take 1 tablet (5 mg total) by mouth daily. 30 tablet 0  . albuterol (PROVENTIL HFA;VENTOLIN HFA) 108 (90 BASE) MCG/ACT inhaler Inhale 2 puffs into the lungs every 6 (six) hours as needed. For shortness of breath      Musculoskeletal: Strength & Muscle Tone: within normal limits Gait & Station: normal Patient leans: N/A  Psychiatric Specialty Exam: Physical Exam  Constitutional: He appears well-developed and well-nourished.  HENT:  Head: Normocephalic.  Respiratory: Effort normal.  Musculoskeletal: Normal range of motion.  Neurological: He is alert.  Psychiatric: His speech is normal and behavior is normal. Thought content is paranoid. Thought content is not delusional. Cognition and memory are normal. He expresses impulsivity. He exhibits a depressed mood.    Review of Systems  Psychiatric/Behavioral: Positive for hallucinations. Negative for depression, memory loss, substance abuse and suicidal ideas. The patient is not nervous/anxious and does not have insomnia.   All other systems reviewed and are negative.   Blood pressure 116/70, pulse (!) 57, temperature 97.7 F (36.5 C), temperature source Oral, resp. rate 16, height 5' 11" (1.803 m), weight 220 lb (99.8 kg), SpO2 95 %.Body mass index is 30.68 kg/m.  General Appearance: Disheveled  Eye Contact:  Fair  Speech:  Clear and Coherent  Volume:  Normal  Mood:  Irritable  Affect:  Congruent  Thought Process:  Disorganized  Orientation:  Full (Time, Place, and Person)  Thought Content:  Hallucinations: Auditory and Ideas of Reference:   Paranoia  Suicidal Thoughts:  No  Homicidal Thoughts:  No  Memory:  Immediate;   Good Recent;   Fair Remote;   Fair  Judgement:  Impaired  Insight:  Lacking  Psychomotor Activity:  Normal  Concentration:  Concentration: Good and  Attention Span: Good  Recall:  Smiley Houseman of Knowledge:  Good  Language:  Good  Akathisia:  No  Handed:  Right  AIMS (if indicated):     Assets:  Agricultural consultant Housing Social Support  ADL's:  Intact  Cognition:  WNL  Sleep:   Fair     Treatment Plan Summary: Daily contact with patient to assess and evaluate symptoms and progress in treatment and Medication management (see MAR )  Disposition: Recommend psychiatric Inpatient admission when medically cleared.  Ethelene Hal, NP 01/11/2018 11:53 AM  Patient seen face-to-face for psychiatric evaluation, chart reviewed and case discussed with the physician extender and developed treatment plan. Reviewed the information documented and agree with the treatment plan. Corena Pilgrim, MD

## 2018-01-11 NOTE — Progress Notes (Signed)
At the request of the [facility] treatment psychiatric team the CSW received verbal permission to speak to the pt's ACTT team with [facility].  CSW spoke to an ACTT team member who was on the crisis line who stated pt no longer receives services from [facility].  Please reconsult if future social work needs arise.  CSW signing off, as social work intervention is no longer needed.  Dorothe PeaJonathan F. Nat Lowenthal, LCSW, LCAS, [facility] Clinical Social Worker Ph: [phone]

## 2018-01-12 NOTE — ED Notes (Signed)
Pt has been calm and cooperative. He stays in bed most of the time. Will come to the nurse's station and ask for snacks.

## 2018-01-12 NOTE — BH Assessment (Addendum)
[facility] Assessment Progress Note    As a result of Dr. Jannifer FranklinAkintayo' evaluation, patient continues to meet inpatient criteria and is most [facility] hall [facility].  However, due to the acuity on the unit, combined with staffing issues due to three 1:1, patient will most likely not be transferred today and TTS will have to explore outside referral options.  Also, [facility] was contacted to check the patient's status with that facility and they indicated that his case was terminated with that agency.  Patient will need to resume ACTT services when he is discharged from his psychiatric hospitalization because his behavior has been too erratic and he will need regular monitoring to insure that he is taking his medications compliantly.

## 2018-01-12 NOTE — ED Notes (Signed)
SBAR Report received from previous nurse. Pt received calm and visible on unit. Pt denies current SI/ HI, A/V H, depression, anxiety, or pain at this time, and appears otherwise stable and free of distress. Pt reminded of camera surveillance, q 15 min rounds, and rules of the milieu. Will continue to assess. 

## 2018-01-12 NOTE — ED Notes (Signed)
Patient has interacted with staff and peers appropriately during this shift. Patient came to desk ask for snacks and soda politely.

## 2018-01-13 ENCOUNTER — Inpatient Hospital Stay (HOSPITAL_COMMUNITY): Admission: AD | Admit: 2018-01-13 | Payer: Medicaid Other | Admitting: Psychiatry

## 2018-01-13 ENCOUNTER — Encounter (HOSPITAL_COMMUNITY): Payer: Self-pay

## 2018-01-13 ENCOUNTER — Other Ambulatory Visit: Payer: Self-pay

## 2018-01-13 ENCOUNTER — Inpatient Hospital Stay (HOSPITAL_COMMUNITY)
Admission: AD | Admit: 2018-01-13 | Discharge: 2018-01-22 | DRG: 885 | Disposition: A | Discharge status: Home / Self Care | Payer: Medicaid Other | Source: Intra-hospital | Attending: Psychiatry | Admitting: Psychiatry

## 2018-01-13 DIAGNOSIS — G4700 Insomnia, unspecified: Secondary | ICD-10-CM | POA: Diagnosis not present

## 2018-01-13 DIAGNOSIS — F29 Unspecified psychosis not due to a substance or known physiological condition: Secondary | ICD-10-CM | POA: Diagnosis not present

## 2018-01-13 DIAGNOSIS — F17210 Nicotine dependence, cigarettes, uncomplicated: Secondary | ICD-10-CM | POA: Diagnosis present

## 2018-01-13 DIAGNOSIS — F200 Paranoid schizophrenia: Secondary | ICD-10-CM

## 2018-01-13 DIAGNOSIS — R442 Other hallucinations: Secondary | ICD-10-CM | POA: Diagnosis not present

## 2018-01-13 DIAGNOSIS — F4310 Post-traumatic stress disorder, unspecified: Secondary | ICD-10-CM | POA: Diagnosis present

## 2018-01-13 DIAGNOSIS — Z91013 Allergy to seafood: Secondary | ICD-10-CM | POA: Diagnosis not present

## 2018-01-13 DIAGNOSIS — R440 Auditory hallucinations: Secondary | ICD-10-CM | POA: Diagnosis not present

## 2018-01-13 DIAGNOSIS — Z79899 Other long term (current) drug therapy: Secondary | ICD-10-CM | POA: Diagnosis not present

## 2018-01-13 DIAGNOSIS — F203 Undifferentiated schizophrenia: Secondary | ICD-10-CM

## 2018-01-13 MED ORDER — BENZTROPINE MESYLATE 0.5 MG PO TABS
0.5000 mg | ORAL_TABLET | Freq: Two times a day (BID) | ORAL | Status: DC
  Administered 2018-01-13 – 2018-01-22 (×18): 0.5 mg via ORAL
  Filled 2018-01-13 (×21): qty 1

## 2018-01-13 MED ORDER — ALUM & MAG HYDROXIDE-SIMETH 200-200-20 MG/5ML PO SUSP: 30.0000 mL | ORAL | Status: DC | PRN

## 2018-01-13 MED ORDER — TRAZODONE HCL 50 MG PO TABS: 50.0000 mg | ORAL_TABLET | Freq: Every evening | ORAL | Status: DC | PRN

## 2018-01-13 MED ORDER — ASENAPINE MALEATE 5 MG SL SUBL
10.0000 mg | SUBLINGUAL_TABLET | Freq: Two times a day (BID) | SUBLINGUAL | Status: DC | PRN
  Administered 2018-01-13: 10 mg via SUBLINGUAL
  Filled 2018-01-13: qty 2

## 2018-01-13 MED ORDER — ACETAMINOPHEN 325 MG PO TABS
650.0000 mg | ORAL_TABLET | Freq: Four times a day (QID) | ORAL | Status: DC | PRN
  Administered 2018-01-15 – 2018-01-17 (×3): 650 mg via ORAL
  Filled 2018-01-13 (×3): qty 2

## 2018-01-13 MED ORDER — TRAZODONE HCL 100 MG PO TABS
100.0000 mg | ORAL_TABLET | Freq: Every evening | ORAL | Status: DC | PRN
  Administered 2018-01-13 – 2018-01-21 (×9): 100 mg via ORAL
  Filled 2018-01-13 (×22): qty 1

## 2018-01-13 MED ORDER — ALBUTEROL SULFATE HFA 108 (90 BASE) MCG/ACT IN AERS: 2.0000 | INHALATION_SPRAY | Freq: Four times a day (QID) | RESPIRATORY_TRACT | Status: DC | PRN

## 2018-01-13 MED ORDER — MAGNESIUM HYDROXIDE 400 MG/5ML PO SUSP: 30.0000 mL | Freq: Every day | ORAL | Status: DC | PRN

## 2018-01-13 MED ORDER — RISPERIDONE 0.5 MG PO TABS
0.5000 mg | ORAL_TABLET | Freq: Two times a day (BID) | ORAL | Status: DC
  Administered 2018-01-13 – 2018-01-14 (×2): 0.5 mg via ORAL
  Filled 2018-01-13 (×6): qty 1

## 2018-01-13 MED ORDER — TRAZODONE HCL 100 MG PO TABS
100.0000 mg | ORAL_TABLET | Freq: Every day | ORAL | Status: DC
  Administered 2018-01-13: 100 mg via ORAL
  Filled 2018-01-13 (×2): qty 1

## 2018-01-13 MED ORDER — HYDROXYZINE HCL 25 MG PO TABS
25.0000 mg | ORAL_TABLET | Freq: Three times a day (TID) | ORAL | Status: DC | PRN
  Administered 2018-01-13 – 2018-01-14 (×2): 25 mg via ORAL
  Filled 2018-01-13 (×2): qty 1

## 2018-01-13 NOTE — BHH Suicide Risk Assessment (Addendum)
[facility] INPATIENT:  Family/Significant Other Suicide Prevention Education  Suicide Prevention Education:  Contact Attempts: Carolyn Stareonya Honor, payee, [phone], has been identified by the patient as the family member/significant other with whom the patient will be residing, and identified as the person(s) who will aid the patient in the event of a mental health crisis.  With written consent from the patient, two attempts were made to provide suicide prevention education, prior to and/or following the patient's discharge.  We were unsuccessful in providing suicide prevention education.  A suicide education pamphlet was given to the patient to share with family/significant other.  Date and time of first attempt:01/13/18, 1536 Date and time of second attempt: 01/15/18 at 2:42PM (no answer/no vm)--[place]. 01/15/2018 2:53 PM   [name], Gregory Jon, LCSW 01/13/2018, 3:36 PM

## 2018-01-13 NOTE — Tx Team (Signed)
Initial Treatment Plan 01/13/2018 6:21 PM Curtis RasherNadon Doig YNW:[id number]    PATIENT STRESSORS: Financial difficulties Medication change or noncompliance   PATIENT STRENGTHS: Average or above average intelligence   PATIENT IDENTIFIED PROBLEMS: "I don't even know why I am here"  "I guess I need help getting a home"                   DISCHARGE CRITERIA:  Improved stabilization in mood, thinking, and/or behavior  PRELIMINARY DISCHARGE PLAN: Placement in alternative living arrangements  PATIENT/FAMILY INVOLVEMENT: This treatment plan has been presented to and reviewed with the patient, Curtis Davenport, and/or family member, The patient and family have been given the opportunity to ask questions and make suggestions.  Jerrye BushyLaRonica R Jadin Creque, RN 01/13/2018, 6:21 PM

## 2018-01-13 NOTE — ED Notes (Signed)
Patient offered a shower but declined at this time.

## 2018-01-13 NOTE — BH Assessment (Signed)
[facility]  Per Juanetta BeetsJacqueline Norman, DO, this pt requires psychiatric hospitalization.  Malva LimesLinsey Strader, RN, [facility]; [facility] will be ready to receive pt at 11:30.  Pt presents under IVC initiated by Thedore MinsMojeed Akintayo, MD, and IVC documents have [facility].  Pt's nurse, Aram BeechamCynthia, has been notified, and agrees to call report to [phone].  Pt is to be transported via [employer] examinerlaw enforcement.   [name] [name], [place] Behavioral Health Coordinator [phone]

## 2018-01-13 NOTE — B[facility] Group Notes (Signed)
[facility] LCSW Group Therapy Note  Date/Time: 01/13/18, 1315  Type of Therapy and Topic:  Group Therapy:  Overcoming Obstacles  Participation Level:  Did not attend  Description of Group:    In this group patients will be encouraged to explore what they see as obstacles to their own wellness and recovery. They will be guided to discuss their thoughts, feelings, and behaviors related to these obstacles. The group will process together ways to cope with barriers, with attention given to specific choices patients can make. Each patient will be challenged to identify changes they are motivated to make in order to overcome their obstacles. This group will be process-oriented, with patients participating in exploration of their own experiences as well as giving and receiving support and challenge from other group members.  Therapeutic Goals: 1. Patient will identify personal and current obstacles as they relate to admission. 2. Patient will identify barriers that currently interfere with their wellness or overcoming obstacles.  3. Patient will identify feelings, thought process and behaviors related to these barriers. 4. Patient will identify two changes they are willing to make to overcome these obstacles:    Summary of Patient Progress      Therapeutic Modalities:   Cognitive Behavioral Therapy Solution Focused Therapy Motivational Interviewing Relapse Prevention Therapy  Daleen SquibbGreg Adelis Docter, LCSW

## 2018-01-13 NOTE — ED Notes (Signed)
Report called to Charity fundraiserBeverly RN at [facility].  Police called for transport.

## 2018-01-13 NOTE — ED Notes (Signed)
Police here to pick up patient.  No belongings here, Mom took them home.

## 2018-01-13 NOTE — BHH Counselor (Addendum)
Adult Comprehensive Assessment  Patient ID: Curtis Davenport, male   DOB: 1991-05-19, 27 y.o.   MRN: [id number]  Information Source: Information source: Patient  Current Stressors:  Patient states their primary concerns and needs for treatment are:: (Pt quite limited in the amount of information he could provide.) Patient states their goals for this hospitilization and ongoing recovery are:: "getting more information about who I am." Housing / Lack of housing: Pt reports he is homeless.    Living/Environment/Situation:  Living Arrangements: Other (Comment)(Pt reports he is homeless, last at [facility] in [place]) Living conditions (as described by patient or guardian): temporary Who else lives in the home?: other residents How long has patient lived in current situation?: 1 week, pt reports he left about a week ago. What is atmosphere in current home: Temporary  Family History:  Marital status: Single Are you sexually active?: No What is your sexual orientation?: heterosexual Does patient have children?: No  Childhood History:  By whom was/is the patient raised?: Both parents Additional childhood history information: Pt reports he was raised by both parents, lived in [place] and [place] Description of patient's relationship with caregiver when they were a child: mom: good, dad: good Patient's description of current relationship with people who raised him/her: Pt reports he doesn't see his parents much, but he does get along with them.  Not sure where they live. Does patient have siblings?: Yes Number of Siblings: (too many) Description of patient's current relationship with siblings: Pt reports little contact.  Not sure where they live. Did patient suffer any verbal/emotional/physical/sexual abuse as a child?: No(I don't know) Did patient suffer from severe childhood neglect?: (Pt unable to answer)  Education:  Highest grade of school patient has completed: HS diploma, [employer] 2013 Currently a student?: No Learning disability?: (unknown)  Employment/Work Situation:   Employment situation: On disability Why is patient on disability: psychosis/mental health How long has patient been on disability: 1 year? Patient's job has been impacted by current illness: (na) Where was the patient employed at that time?: none reported Did You Receive Any Psychiatric Treatment/Services While in the [employer]?: (no Financial planner)  Surveyor, quantity Resources:   Financial resources: [employer], Medicaid Does patient have a Lawyer or guardian?: Yes Name of representative payee or guardian: Emergency planning/management officer, payee.  [phone]  Alcohol/Substance Abuse:   What has been your use of drugs/alcohol within the last 12 months?: denies alcohol or drugs If attempted suicide, did drugs/alcohol play a role in this?: (na) Alcohol/Substance Abuse Treatment Hx: Denies past history Has alcohol/substance abuse ever caused legal problems?: Yes(Possession of Marijuana)  Social Support System:   Describe Community Support System: family, payee Type of faith/religion: none How does patient's faith help to cope with current illness?: na  Leisure/Recreation:   Leisure and Hobbies: sports, go to the bar  Strengths/Needs:   What is the patient's perception of their strengths?: Pt unable to answer Patient states they can use these personal strengths during their treatment to contribute to their recovery: Pt unable to answer Patient states these barriers may affect/interfere with their treatment: Pt unable to answer Patient states these barriers may affect their return to the community: Pt unable to answer Other important information patient would like considered in planning for their treatment: Pt unable to answer  Discharge Plan:   Currently receiving community mental health services: Yes (From Whom)([facility], [place]) Does patient have access to  transportation?: No Does patient have financial barriers related to  discharge medications?: No Plan for no access to transportation at discharge: CSW assessing for appropriate plan Plan for living situation after discharge: Pt reports he is homeless and unsure--possibly shelter Will patient be returning to same living situation after discharge?: No  Summary/Recommendations:   Summary and Recommendations (to be completed by the evaluator): Pt is 27 year old [place] from [place].  Pt is diagnosed with schizophrenia and was admitted after being placed under IVC at his psychiatrists office due to agitation, psychosis, and suicidal ideation.  Recommendations for pt include crisis stabilization, therapeutic milieu, attend and participate in groups, medication management, and development of comprehensive mental wellness plan.  Curtis Davenport, Curtis Looney Jon. 01/13/2018

## 2018-01-13 NOTE — B[facility] Counselor (Signed)
Clinician received a [facility] expressing [facility] did not receive the pt's referral. Clinician re-faxed the pt's [facility]. Day-shift to follow up.   Redmond Pullingreylese D Henretter Piekarski, MS, [facility], [facility] Triage Specialist [phone]

## 2018-01-13 NOTE — Consult Note (Addendum)
[facility] Face-to-Face Psychiatry Consult   Reason for Consult:  Agitation and paranoia Referring Physician:  EDP Patient Identification: Curtis Davenport MRN:  [id number] Principal Diagnosis: Paranoid schizophrenia (HCC) Diagnosis:   Patient Active Problem List   Diagnosis Date Noted  . Paranoid schizophrenia (HCC) [F20.0] 01/10/2018  . Schizophrenia, undifferentiated (HCC) [F20.3]     Total Time spent with patient: 45 minutes  Subjective:   Curtis Davenport is a 27 y.o. male patient admitted with psychosis.  HPI:  Pt was seen and chart reviewed with treatment team and Dr Sharma Covert. Pt denies suicidal/homicidal ideation, denies auditory/visual hallucinations. Pt   does appear to be responding to internal stimuli and is paranoid. Pt was place under IVC after arriving at [facility] for becoming agitated when he went to his appointment at [facility]. Pt was verbalizing suicidal ideation upon arrival at the ED but today did not voice this during assessment. Pt's UDS and BAL are negative. Pt stated he has been living with his grandparents but when they moved he was unable to live with them and is now homeless.  Pt is not taking care of his personal hygiene. Pt would benefit from an inpatient psychiatric admission for medication management and crisis stabilization.   Past Psychiatric History: As above  Risk to Self: None. Denies SI.  Risk to Others: None. Denies HI.  Prior Inpatient Therapy: Prior Inpatient Therapy: Yes Prior Therapy Dates: 2015 Prior Therapy Facilty/Provider(s): [facility] Reason for Treatment: MH issues Prior Outpatient Therapy: Prior Outpatient Therapy: Yes Prior Therapy Dates: Ongoing Prior Therapy Facilty/Provider(s): [facility](per notes) Reason for Treatment: MH issues Does patient have an ACCT team?: No Does patient have Intensive In-House Services?  : No Does patient have [facility] services? : No Does patient have P4CC services?: No  Past Medical History:  Past Medical  History:  Diagnosis Date  . Hallucinations   . PTSD (post-traumatic stress disorder)   . Schizophrenia (HCC)    History reviewed. No pertinent surgical history. Family History: History reviewed. No pertinent family history. Family Psychiatric  History: Unknown Social History:  Social History   Substance and Sexual Activity  Alcohol Use Yes     Social History   Substance and Sexual Activity  Drug Use No    Social History   Socioeconomic History  . Marital status: Single    Spouse name: Not on file  . Number of children: Not on file  . Years of education: Not on file  . Highest education level: Not on file  Occupational History  . Not on file  Social Needs  . Financial resource strain: Not on file  . Food insecurity:    Worry: Not on file    Inability: Not on file  . Transportation needs:    Medical: Not on file    Non-medical: Not on file  Tobacco Use  . Smoking status: Current Some Day Smoker    Packs/day: 0.50  . Smokeless tobacco: Never Used  Substance and Sexual Activity  . Alcohol use: Yes  . Drug use: No  . Sexual activity: Yes  Lifestyle  . Physical activity:    Days per week: Not on file    Minutes per session: Not on file  . Stress: Not on file  Relationships  . Social connections:    Talks on phone: Not on file    Gets together: Not on file    Attends religious service: Not on file    Active member of club or organization: Not on file  Attends meetings of clubs or organizations: Not on file    Relationship status: Not on file  Other Topics Concern  . Not on file  Social History Narrative  . Not on file   Additional Social History: N/A    Allergies:   Allergies  Allergen Reactions  . Shellfish Allergy Hives and Swelling    Labs:  No results found for this or any previous visit (from the past 48 hour(s)).  Current Facility-Administered Medications  Medication Dose Route Frequency Provider Last Rate Last Dose  . albuterol (PROVENTIL  HFA;VENTOLIN HFA) 108 (90 Base) MCG/ACT inhaler 2 puff  2 puff Inhalation Q6H PRN Akintayo, Mojeed, MD      . asenapine (SAPHRIS) sublingual tablet 10 mg  10 mg Sublingual BID PRN Akintayo, Mojeed, MD      . benztropine (COGENTIN) tablet 0.5 mg  0.5 mg Oral BID Akintayo, Mojeed, MD   0.5 mg at 01/13/18 1031  . hydrOXYzine (ATARAX/VISTARIL) tablet 25 mg  25 mg Oral TID PRN Akintayo, Mojeed, MD      . risperiDONE (RISPERDAL) tablet 0.5 mg  0.5 mg Oral BID Akintayo, Mojeed, MD   0.5 mg at 01/13/18 1032  . traZODone (DESYREL) tablet 100 mg  100 mg Oral QHS Akintayo, Mojeed, MD   100 mg at 01/12/18 2133   Current Outpatient Medications  Medication Sig Dispense Refill  . ARIPiprazole (ABILIFY) 5 MG tablet Take 1 tablet (5 mg total) by mouth daily. 30 tablet 0  . albuterol (PROVENTIL HFA;VENTOLIN HFA) 108 (90 BASE) MCG/ACT inhaler Inhale 2 puffs into the lungs every 6 (six) hours as needed. For shortness of breath      Musculoskeletal: Strength & Muscle Tone: within normal limits Gait & Station: normal Patient leans: N/A  Psychiatric Specialty Exam: Physical Exam  Nursing note and vitals reviewed. Constitutional: He appears well-developed and well-nourished.  HENT:  Head: Normocephalic.  Neck: Normal range of motion.  Respiratory: Effort normal.  Musculoskeletal: Normal range of motion.  Neurological: He is alert.  Psychiatric: His speech is normal and behavior is normal. Thought content is paranoid. Thought content is not delusional. Cognition and memory are normal. He expresses impulsivity. He exhibits a depressed mood.    Review of Systems  Psychiatric/Behavioral: Positive for hallucinations. Negative for depression, memory loss, substance abuse and suicidal ideas. The patient is not nervous/anxious and does not have insomnia.   All other systems reviewed and are negative.   Blood pressure 132/80, pulse 69, temperature 98.8 F (37.1 C), temperature source Oral, resp. rate 14, height 5'  11" (1.803 m), weight 220 lb (99.8 kg), SpO2 100 %.Body mass index is 30.68 kg/m.  General Appearance: Disheveled  Eye Contact:  Fair  Speech:  Clear and Coherent  Volume:  Normal  Mood:  Irritable  Affect:  Congruent  Thought Process:  Disorganized  Orientation:  Full (Time, Place, and Person)  Thought Content:  Hallucinations: Auditory and Ideas of Reference:   Paranoia  Suicidal Thoughts:  No  Homicidal Thoughts:  No  Memory:  Immediate;   Good Recent;   Fair Remote;   Fair  Judgement:  Impaired  Insight:  Lacking  Psychomotor Activity:  Normal  Concentration:  Concentration: Good and Attention Span: Good  Recall:  Fair  Fund of Knowledge:  Good  Language:  Good  Akathisia:  No  Handed:  Right  AIMS (if indicated):   N/A  Assets:  Architect Housing Social Support  ADL's:  Intact  Cognition:  WNL  Sleep:   Fair     Treatment Plan Summary: Daily contact with patient to assess and evaluate symptoms and progress in treatment and Medication management (see MAR )  Disposition: Recommend psychiatric Inpatient admission when medically cleared.  Laveda AbbeLaurie Britton Parks, NP 01/13/2018 10:33 AM   Patient seen face-to-face for psychiatric evaluation, chart reviewed and case discussed with the physician extender and developed treatment plan. Reviewed the information documented and agree with the treatment plan.  Juanetta BeetsJacqueline Rigley Niess, DO 01/13/18 3:28 PM

## 2018-01-13 NOTE — Progress Notes (Signed)
Adult Psychoeducational Group Note  Date:  01/13/2018 Time:  9:15 PM  Group Topic/Focus:  Wrap-Up Group:   The focus of this group is to help patients review their daily goal of treatment and discuss progress on daily workbooks.  Participation Level:  Active  Participation Quality:  Appropriate  Affect:  Excited  Cognitive:  Disorganized  Insight: Appropriate  Engagement in Group:  Engaged  Modes of Intervention:  Discussion and Support  Additional Comments:  Pt was active in group. Pt said a positive thing aout today was the poetry he had in his mind. Pt goal for tomorrow is to find the good in the day.  Ova FreshwaterChristina Jailin Manocchio 01/13/2018, 9:15 PM

## 2018-01-14 DIAGNOSIS — F200 Paranoid schizophrenia: Principal | ICD-10-CM

## 2018-01-14 DIAGNOSIS — R440 Auditory hallucinations: Secondary | ICD-10-CM

## 2018-01-14 DIAGNOSIS — F17210 Nicotine dependence, cigarettes, uncomplicated: Secondary | ICD-10-CM

## 2018-01-14 DIAGNOSIS — R442 Other hallucinations: Secondary | ICD-10-CM

## 2018-01-14 DIAGNOSIS — Z79899 Other long term (current) drug therapy: Secondary | ICD-10-CM

## 2018-01-14 MED ORDER — HYDROXYZINE HCL 50 MG PO TABS
50.0000 mg | ORAL_TABLET | Freq: Four times a day (QID) | ORAL | Status: DC | PRN
  Administered 2018-01-14: 50 mg via ORAL
  Filled 2018-01-14 (×2): qty 1

## 2018-01-14 MED ORDER — ZIPRASIDONE MESYLATE 20 MG IM SOLR: 20.0000 mg | INTRAMUSCULAR | Status: DC | PRN

## 2018-01-14 MED ORDER — FLUPHENAZINE HCL 5 MG PO TABS
5.0000 mg | ORAL_TABLET | Freq: Two times a day (BID) | ORAL | Status: DC
  Administered 2018-01-14 – 2018-01-22 (×17): 5 mg via ORAL
  Filled 2018-01-14 (×20): qty 1

## 2018-01-14 MED ORDER — OLANZAPINE 10 MG PO TBDP
10.0000 mg | ORAL_TABLET | Freq: Three times a day (TID) | ORAL | Status: DC | PRN
Start: 2018-01-14 — End: 2018-01-22
  Administered 2018-01-15: 10 mg via ORAL
  Filled 2018-01-14: qty 1

## 2018-01-14 MED ORDER — LORAZEPAM 1 MG PO TABS: 1.0000 mg | ORAL_TABLET | ORAL | Status: DC | PRN

## 2018-01-14 NOTE — Progress Notes (Signed)
Recreation Therapy Notes  INPATIENT RECREATION THERAPY ASSESSMENT  Patient Details Name: Erenest Rasheradon Ozawa MRN: [id number] DOB: 1991-01-30 Today's Date: 01/14/2018       Information Obtained From: Patient  Able to Participate in Assessment/Interview: Yes  Patient Presentation: Alert(Repeating self; seemed distracted)  Reason for Admission (Per Patient): Other (Comments)(Friends)  Patient Stressors: ("I can't really explain it")  Coping Skills:   Isolation, Sports, TV, Music, Exercise, Meditate, Deep Breathing, Talk, Art, Prayer, Avoidance, Hot Bath/Shower  Leisure Interests (2+):  Individual - Other (Comment), Sports - Basketball, Sports - Football(Sleep)  Frequency of Recreation/Participation: Other (Comment)(Daily)  Awareness of Community Resources:  Yes  Community Resources:  [place]  Current Use: Yes  If no, Barriers?:    Expressed Interest in State [employer] CorporationCommunity Resource Information: No  Enbridge EnergyCounty of Residence[place]  [place]  Patient Main Form of Transportation: Set designerCar  Patient Strengths:  Wisdom; Knowledge; Memory  Patient Identified Areas of Improvement:  Not being so paranoid  Patient Goal for Hospitalization:  "Get better and come here for history"  Current SI (including self-harm):  No  Current HI:  No  Current AVH: No  Staff Intervention Plan: Group Attendance, Collaborate with Interdisciplinary Treatment Team  Consent to Intern Participation: N/A    Caroll RancherMarjette Tosh Glaze, LRT/CTRS   Lillia AbedLindsay, Loyde Orth A 01/14/2018, 2:21 PM

## 2018-01-14 NOTE — Progress Notes (Signed)
Adult Psychoeducational Group Note  Date:  01/14/2018 Time:  8:43 PM  Group Topic/Focus:  Wrap-Up Group:   The focus of this group is to help patients review their daily goal of treatment and discuss progress on daily workbooks.  Participation Level:  Active  Participation Quality:  Appropriate  Affect:  Appropriate  Cognitive:  Appropriate  Insight: Appropriate  Engagement in Group:  Engaged  Modes of Intervention:  Discussion  Additional Comments:  The patient expressed that he rates today a 10.The patient also said that she attended group.  [name], Topher Buenaventura Lee 01/14/2018, 8:43 PM

## 2018-01-14 NOTE — Plan of Care (Signed)
D: Pt denies SI/HI/AVH. Pt presents on the unit inappropriate laughter. Pt very paranoid, pt does not appear to need a roommate at this time. Pt and his roommate appear very paranoid of each other. Pt appears to be responding to internal stimuli .   A: Pt was offered support and encouragement. Pt was given scheduled medications. Pt was encourage to attend groups. Q 15 minute checks were done for safety.   R: safety maintained on unit.   Problem: Safety: Goal: Periods of time without injury will increase Outcome: Progressing

## 2018-01-14 NOTE — Progress Notes (Signed)
Pt presents very paranoid, pt having a hard time sleeping, pt appears to be having issue with his roommate.

## 2018-01-14 NOTE — H&P (Signed)
Psychiatric Admission Assessment Adult  Patient Identification: Curtis Davenport MRN:  [id number] Date of Evaluation:  01/14/2018 Chief Complaint:  SCHIZOPHRENIA Principal Diagnosis: Schizophrenia, paranoid, chronic (HCC) Diagnosis:   Patient Active Problem List   Diagnosis Date Noted  . Schizophrenia, paranoid, chronic (HCC) [F20.0] 01/13/2018  . Psychosis (HCC) [F29]   . Paranoid schizophrenia (HCC) [F20.0] 01/10/2018  . Schizophrenia, undifferentiated (HCC) [F20.3]    History of Present Illness:  Curtis Davenport is a 27 y/o M with history of schizophrenia who was admitted from [facility] on IVC after being brought in by his mother with worsening symptoms of psychosis including responding to internal stimuli, not caring for his personal hygiene, and episodes of agitation including at his most recent outpatient mental health follow up appointment. Pt was medically cleared in the ED and then transferred to [facility] for additional treatment and stabilization.  Upon initial interview, pt is vague, circumstantial, and minimizing. He is generally cooperative with the interview, but he is overall a poor historian. When asked why he was brought to the hospital, he shares, "A tough situation, trying to correct with my family. Members of my family basically brought me here. I feel like the situation, that's basically come about to help me." Pt was asked directly about episode of agitation at his outpatient appointment, and he states, "They said they couldn't accept me there. I got a little dis-railed, which to me means different directions. Sightings and categorizing - I just look somewhere else." Pt reports that he has been homeless and staying at the shelter in [place] recently. Pt endorses AH of "shrieks and loud noises coming from the atmosphere." He denies VH. He endorses some delusional content that he has a microchip implanted in him "from the [employer]." When asked about his mood, pt states, "I learned that men  don't have emotions. We have feelings. My mood is under that structure." He reports his sleep has been adequate. He denies symptoms of mania, OCD, and PTSD. He denies illicit substance use aside from use of tobacco about 0.25 ppd.  Discussed with patient about treatment options. He is unsure about his adherence to his medications and his previous medication trials. When naming medications, pt recalls previous trial of prolixin, and he is in agreement to trial of that medication. He was in agreement with the above plan, and he had no further questions, comments, or concerns.  Associated Signs/Symptoms: Depression Symptoms:  anxiety, (Hypo) Manic Symptoms:  Delusions, Distractibility, Hallucinations, Impulsivity, Irritable Mood, Labiality of Mood, Anxiety Symptoms:  NA Psychotic Symptoms:  Delusions, Hallucinations: Auditory Ideas of Reference, Paranoia, PTSD Symptoms: NA Total Time spent with patient: 1 hour  Past Psychiatric History:  - dx of schizophrenia - about 5 previous inpt stays with last admission about 1 year ago - Current outpatient at [facility] - Denies hx of suicide attempt  Is the patient at risk to self? Yes.    Has the patient been a risk to self in the past 6 months? Yes.    Has the patient been a risk to self within the distant past? Yes.    Is the patient a risk to others? Yes.    Has the patient been a risk to others in the past 6 months? Yes.    Has the patient been a risk to others within the distant past? Yes.     Prior Inpatient Therapy:   Prior Outpatient Therapy:    Alcohol Screening: 1. How often do you have a drink containing alcohol?: Never  2. How many drinks containing alcohol do you have on a typical day when you are drinking?: 1 or 2 3. How often do you have six or more drinks on one occasion?: Never AUDIT-C Score: 0 4. How often during the last year have you found that you were not able to stop drinking once you had started?:  Never 5. How often during the last year have you failed to do what was normally expected from you becasue of drinking?: Never 6. How often during the last year have you needed a first drink in the morning to get yourself going after a heavy drinking session?: Never 7. How often during the last year have you had a feeling of guilt of remorse after drinking?: Never 8. How often during the last year have you been unable to remember what happened the night before because you had been drinking?: Never 9. Have you or someone else been injured as a result of your drinking?: No 10. Has a relative or friend or a doctor or another health worker been concerned about your drinking or suggested you cut down?: No Alcohol Use Disorder Identification Test Final Score (AUDIT): 0 Substance Abuse History in the last 12 months:  No. Consequences of Substance Abuse: NA Previous Psychotropic Medications: Yes  Psychological Evaluations: Yes  Past Medical History:  Past Medical History:  Diagnosis Date  . Hallucinations   . PTSD (post-traumatic stress disorder)   . Schizophrenia (HCC)    History reviewed. No pertinent surgical history. Family History: History reviewed. No pertinent family history. Family Psychiatric  History: Pt is not aware of family psychiatric history. Tobacco Screening:   Social History: Pt was raised in [place]. He was staying with his grandmother until about 1 week ago, and he has been staying at shelter in [place]. He is unemployed. He has completed some college. He denies legal and trauma history.  Social History   Substance and Sexual Activity  Alcohol Use Yes     Social History   Substance and Sexual Activity  Drug Use No    Additional Social History: Marital status: Single Are you sexually active?: No What is your sexual orientation?: heterosexual Does patient have children?: No                         Allergies:   Allergies  Allergen Reactions   . Shellfish Allergy Hives and Swelling   Lab Results: No results found for this or any previous visit (from the past 48 hour(s)).  Blood Alcohol level:  Lab Results  Component Value Date   ETH <10 01/10/2018   ETH <5 08/31/2016    Metabolic Disorder Labs:  No results found for: HGBA1C, MPG No results found for: PROLACTIN No results found for: CHOL, TRIG, HDL, CHOLHDL, VLDL, LDLCALC  Current Medications: Current Facility-Administered Medications  Medication Dose Route Frequency Provider Last Rate Last Dose  . acetaminophen (TYLENOL) tablet 650 mg  650 mg Oral Q6H PRN Laveda Abbe, NP      . albuterol (PROVENTIL HFA;VENTOLIN HFA) 108 (90 Base) MCG/ACT inhaler 2 puff  2 puff Inhalation Q6H PRN Laveda Abbe, NP      . alum & mag hydroxide-simeth (MAALOX/MYLANTA) 200-200-20 MG/5ML suspension 30 mL  30 mL Oral Q4H PRN Laveda Abbe, NP      . benztropine (COGENTIN) tablet 0.5 mg  0.5 mg Oral BID Laveda Abbe, NP   0.5 mg at 01/14/18 0757  . fluPHENAZine (  PROLIXIN) tablet 5 mg  5 mg Oral BID Micheal Likensainville, Alvino Lechuga T, MD   5 mg at 01/14/18 1204  . hydrOXYzine (ATARAX/VISTARIL) tablet 25 mg  25 mg Oral TID PRN Laveda AbbeParks, Laurie Britton, NP   25 mg at 01/14/18 0122  . OLANZapine zydis (ZYPREXA) disintegrating tablet 10 mg  10 mg Oral Q8H PRN Micheal Likensainville, Sheli Dorin T, MD       And  . LORazepam (ATIVAN) tablet 1 mg  1 mg Oral PRN Micheal Likensainville, Lisaann Atha T, MD       And  . ziprasidone (GEODON) injection 20 mg  20 mg Intramuscular PRN Micheal Likensainville, Kavion Mancinas T, MD      . magnesium hydroxide (MILK OF MAGNESIA) suspension 30 mL  30 mL Oral Daily PRN Laveda AbbeParks, Laurie Britton, NP      . traZODone (DESYREL) tablet 100 mg  100 mg Oral QHS,MR X 1 Nira ConnBerry, Jason A, NP   100 mg at 01/13/18 2359   PTA Medications: Medications Prior to Admission  Medication Sig Dispense Refill Last Dose  . albuterol (PROVENTIL HFA;VENTOLIN HFA) 108 (90 BASE) MCG/ACT inhaler Inhale 2 puffs into  the lungs every 6 (six) hours as needed. For shortness of breath   > month  . ARIPiprazole (ABILIFY) 5 MG tablet Take 1 tablet (5 mg total) by mouth daily. 30 tablet 0 01/09/2018 at Unknown time    Musculoskeletal: Strength & Muscle Tone: within normal limits Gait & Station: normal Patient leans: N/A  Psychiatric Specialty Exam: Physical Exam  Nursing note and vitals reviewed.   Review of Systems  Constitutional: Negative for chills and fever.  Respiratory: Negative for cough and shortness of breath.   Cardiovascular: Negative for chest pain.  Gastrointestinal: Negative for abdominal pain, heartburn, nausea and vomiting.  Psychiatric/Behavioral: Positive for hallucinations. Negative for depression, substance abuse and suicidal ideas. The patient is not nervous/anxious and does not have insomnia.     Blood pressure 125/62, pulse 74, temperature 97.8 F (36.6 C), temperature source Oral, resp. rate 18, height 6' (1.829 m), weight 87.5 kg (193 lb), SpO2 98 %.Body mass index is 26.18 kg/m.  General Appearance: Disheveled  Eye Contact:  Fair  Speech:  Clear and Coherent and Normal Rate  Volume:  Normal  Mood:  Euthymic  Affect:  Flat  Thought Process:  Coherent, Disorganized and Descriptions of Associations: Loose  Orientation:  Full (Time, Place, and Person)  Thought Content:  Delusions, Hallucinations: Auditory and Tangential  Suicidal Thoughts:  No  Homicidal Thoughts:  No  Memory:  Immediate;   Fair Recent;   Fair Remote;   Fair  Judgement:  Poor  Insight:  Lacking  Psychomotor Activity:  Normal  Concentration:  Concentration: Fair  Recall:  FiservFair  Fund of Knowledge:  Fair  Language:  Fair  Akathisia:  No  Handed:    AIMS (if indicated):     Assets:  Resilience Social Support  ADL's:  Intact  Cognition:  WNL  Sleep:  Number of Hours: 4.5   Treatment Plan Summary: Daily contact with patient to assess and evaluate symptoms and progress in treatment and Medication  management  Observation Level/Precautions:  15 minute checks  Laboratory:  CBC Chemistry Profile HbAIC UDS UA  Psychotherapy:  Encourage participation in groups and therapeutic milieu   Medications:  Start prolixin 5mg  po BID. Continue cogentin 0.5mg  po BID. Continue vistaril 50mg  po q6h prn anxiety. Continue trazodone 100mg  po qhs (may repeat x1 PRN). Continue agitation protocol with zydis/ativan/geodon.  Consultations:    Discharge  Concerns:    Estimated LOS: 5-7 days  Other:     Physician Treatment Plan for Primary Diagnosis: Schizophrenia, paranoid, chronic (HCC) Long Term Goal(s): Improvement in symptoms so as ready for discharge  Short Term Goals: Ability to identify and develop effective coping behaviors will improve  Physician Treatment Plan for Secondary Diagnosis: Principal Problem:   Schizophrenia, paranoid, chronic (HCC)  Long Term Goal(s): Improvement in symptoms so as ready for discharge  Short Term Goals: Ability to identify changes in lifestyle to reduce recurrence of condition will improve  I certify that inpatient services furnished can reasonably be expected to improve the patient's condition.    Micheal Likens, MD 6/25/20193:44 PM

## 2018-01-14 NOTE — [facility] Suicide Risk Assessment (Signed)
[facility]   Nursing information obtained from:  Patient Demographic factors:  Male Current Mental Status:  NA Loss Factors:  NA Historical Factors:  NA Risk Reduction Factors:  Positive social support  Total Time spent with patient: 1 hour Principal Problem: Schizophrenia, paranoid, chronic (HCC) Diagnosis:   Patient Active Problem List   Diagnosis Date Noted  . Schizophrenia, paranoid, chronic (HCC) [F20.0] 01/13/2018  . Psychosis (HCC) [F29]   . Paranoid schizophrenia (HCC) [F20.0] 01/10/2018  . Schizophrenia, undifferentiated (HCC) [F20.3]    Subjective Data: See H&P for details  Continued Clinical Symptoms:  Alcohol Use Disorder Identification Test Final Score (AUDIT): 0 The "Alcohol Use Disorders Identification Test", Guidelines for Use in Primary Care, Second Edition.  World Science writerHealth Organization [facility](WHO). Score between 0-7:  no or low risk or alcohol related problems. Score between 8-15:  moderate risk of alcohol related problems. Score between 16-19:  high risk of alcohol related problems. Score 20 or above:  warrants further diagnostic evaluation for alcohol dependence and treatment.   CLINICAL FACTORS:   Schizophrenia:   Less than 27 years old Paranoid or undifferentiated type  Psychiatric Specialty Exam: Physical Exam  Nursing note and vitals reviewed.   ROS- See H&P for details  Blood pressure 125/62, pulse 74, temperature 97.8 F (36.6 C), temperature source Oral, resp. rate 18, height 6' (1.829 m), weight 87.5 kg (193 lb), SpO2 98 %.Body mass index is 26.18 kg/m.    COGNITIVE FEATURES THAT CONTRIBUTE TO RISK:  None    SUICIDE RISK:   Minimal: No identifiable suicidal ideation.  Patients presenting with no risk factors but with morbid ruminations; may be classified as minimal risk based on the severity of the depressive symptoms  PLAN OF CARE: See H&P for details  I certify that inpatient services furnished can reasonably be  expected to improve the patient's condition.   Micheal Likenshristopher T Janasia Coverdale, MD 01/14/2018, 4:00 PM

## 2018-01-14 NOTE — Plan of Care (Signed)
  Problem: Activity: Goal: Interest or engagement in activities will improve Outcome: Progressing   Problem: Safety: Goal: Periods of time without injury will increase Outcome: Progressing  DAR NOTE: Patient presents with calm affect and pleasant mood.  Denies suicidal thoughts, auditory and visual hallucinations.  Rates depression at 0, hopelessness at 0, and anxiety at 0.  Maintained on routine safety checks.  Medications given as prescribed.  Support and encouragement offered as needed.  Attended group and participated.  States goal for today is "achievement."  Patient observed socializing with peers in the dayroom.  Offered no complaint.

## 2018-01-15 DIAGNOSIS — G4700 Insomnia, unspecified: Secondary | ICD-10-CM

## 2018-01-15 LAB — LIPID PANEL
Cholesterol: 213 mg/dL — ABNORMAL HIGH (ref 0–200)
HDL: 60 mg/dL (ref >40)
LDL CALC: 109 mg/dL — AB (ref 0–99)
TRIGLYCERIDES: 221 mg/dL — AB (ref <150)
Total CHOL/HDL Ratio: 3.6 RATIO
VLDL: 44 mg/dL — AB (ref 0–40)

## 2018-01-15 LAB — HEMOGLOBIN A1C
Hgb A1c MFr Bld: 5.6 % (ref 4.8–5.6)
Mean Plasma Glucose: 114.02 mg/dL

## 2018-01-15 LAB — TSH: TSH: 2.269 u[IU]/mL (ref 0.350–4.500)

## 2018-01-15 NOTE — Plan of Care (Signed)
D: Pt denies SI/HI/AVH. Pt is pleasant and cooperative. Pt presents on the unit less suspicious than yesterday, but pt continues to pace and have inappropriate laughter.   A: Pt was offered support and encouragement. Pt was given scheduled medications. Pt was encourage to attend groups. Q 15 minute checks were done for safety.   R: safety maintained on unit.   Problem: Education: Goal: Emotional status will improve Outcome: Progressing   Problem: Education: Goal: Mental status will improve Outcome: Progressing   Problem: Activity: Goal: Sleeping patterns will improve Outcome: Progressing

## 2018-01-15 NOTE — Progress Notes (Signed)
CSW called [facility] to inquire about pt history with them. [facility] has not been able to contact patient since 09/27/2016. Pt stopped communicating with them and they could not find him. CSW contacted [facility]. Pt had scheduled and missed 3 appts. He had not yet been assessed for medication management with them, therefore, they do not have a medication record. MD notified of above.   Carlus Stay S. Alan RipperHolloway, MSW, LCSW Clinical Social Worker 01/15/2018 3:42 PM

## 2018-01-15 NOTE — Progress Notes (Signed)
D: Patient denies SI, HI or AVH this evening but intermittently appears to be responding to internal stimuli. Patient presents as animated and anxious but pleasant on approach.  Pt. Forwards little and is isolative but states that he had a good day. Pt. Complained of right arm pain, "probably from basketball" for which he was treated.  Pt. Denies any other physical complaints, he reports a good appetite and is sleeping well.    A: Patient given emotional support from RN. Patient encouraged to come to staff with concerns and/or questions. Patient's medication routine continued. Patient's orders and plan of care reviewed.   R: Patient remains appropriate and cooperative. Will continue to monitor patient q15 minutes for safety.

## 2018-01-15 NOTE — Progress Notes (Signed)
[facility] Progress Note  01/15/2018 2:41 PM Curtis Davenport  MRN:  [id number]  Subjective: Curtis Davenport reports, "I'm doing alright. I'm connected. I'm taking the medicines. I feel like it is healing me. I feel healthy".  Curtis Davenport is a 27 y/o M with history of schizophrenia who was admitted from [facility] on IVC after being brought in by his mother with worsening symptoms of psychosis including responding to internal stimuli, not caring for his personal hygiene, and episodes of agitation including at his most recent outpatient mental health follow up appointment. Pt was medically cleared in the ED and then [facility] and [facility]. Upon initial interview, pt is vague, circumstantial, and minimizing. He is generally cooperative with the interview, but he is overall a poor historian. When asked why he was brought to the hospital, he shares, "A tough situation, trying to correct with my family.   Curtis Davenport is seen, chart reviewed. The chart findings discussed with the treatment team. He is alert, oriented to self. He presents aloof, out of it, disconnected & bizarre. His statements are uncoordinated, tangential & detatched. He presents disheveled with gross body odor. However, he is visible on the unit. He attending group sessions & activities. The staff reports that during the group recreational outing today at the courtyard, while playing hoops with the other patients, Curtis Davenport caught the ball, held it on his side & zoomed out of the court. The staff says he returned back to the court later unaware of his behavior. At this point, patient will continue to require inpatient hospitalization with the current plan of care already in progress.There no changes mad on his current plan of care today.  Principal Problem: Schizophrenia, paranoid, chronic (HCC)  Diagnosis:   Patient Active Problem List   Diagnosis Date Noted  . Schizophrenia, paranoid, chronic (HCC) [F20.0] 01/13/2018  . Psychosis  (HCC) [F29]   . Paranoid schizophrenia (HCC) [F20.0] 01/10/2018  . Schizophrenia, undifferentiated (HCC) [F20.3]    Total Time spent with patient: 25 minutes  Past Psychiatric History: See H&P.  Past Medical History:  Past Medical History:  Diagnosis Date  . Hallucinations   . PTSD (post-traumatic stress disorder)   . Schizophrenia (HCC)    History reviewed. No pertinent surgical history.  Family History: History reviewed. No pertinent family history.  Family Psychiatric  History: see H&P.  Social History:  Social History   Substance and Sexual Activity  Alcohol Use Yes     Social History   Substance and Sexual Activity  Drug Use No    Social History   Socioeconomic History  . Marital status: Single    Spouse name: Not on file  . Number of children: Not on file  . Years of education: Not on file  . Highest education level: Not on file  Occupational History  . Not on file  Social Needs  . Financial resource strain: Not on file  . Food insecurity:    Worry: Not on file    Inability: Not on file  . Transportation needs:    Medical: Not on file    Non-medical: Not on file  Tobacco Use  . Smoking status: Current Some Day Smoker    Packs/day: 0.50  . Smokeless tobacco: Never Used  Substance and Sexual Activity  . Alcohol use: Yes  . Drug use: No  . Sexual activity: Yes  Lifestyle  . Physical activity:    Days per week: Not on file    Minutes per session:  Not on file  . Stress: Not on file  Relationships  . Social connections:    Talks on phone: Not on file    Gets together: Not on file    Attends religious service: Not on file    Active member of club or organization: Not on file    Attends meetings of clubs or organizations: Not on file    Relationship status: Not on file  Other Topics Concern  . Not on file  Social History Narrative  . Not on file   Additional Social History:   Sleep: Good  Appetite:  Good  Current Medications: Current  Facility-Administered Medications  Medication Dose Route Frequency Provider Last Rate Last Dose  . acetaminophen (TYLENOL) tablet 650 mg  650 mg Oral Q6H PRN Laveda Abbe, NP      . albuterol (PROVENTIL HFA;VENTOLIN HFA) 108 (90 Base) MCG/ACT inhaler 2 puff  2 puff Inhalation Q6H PRN Laveda Abbe, NP      . alum & mag hydroxide-simeth (MAALOX/MYLANTA) 200-200-20 MG/5ML suspension 30 mL  30 mL Oral Q4H PRN Laveda Abbe, NP      . benztropine (COGENTIN) tablet 0.5 mg  0.5 mg Oral BID Laveda Abbe, NP   0.5 mg at 01/15/18 0944  . fluPHENAZine (PROLIXIN) tablet 5 mg  5 mg Oral BID Micheal Likens, MD   5 mg at 01/15/18 0944  . hydrOXYzine (ATARAX/VISTARIL) tablet 50 mg  50 mg Oral Q6H PRN Micheal Likens, MD   50 mg at 01/14/18 2131  . OLANZapine zydis (ZYPREXA) disintegrating tablet 10 mg  10 mg Oral Q8H PRN Micheal Likens, MD   10 mg at 01/15/18 1203   And  . LORazepam (ATIVAN) tablet 1 mg  1 mg Oral PRN Micheal Likens, MD       And  . ziprasidone (GEODON) injection 20 mg  20 mg Intramuscular PRN Micheal Likens, MD      . magnesium hydroxide (MILK OF MAGNESIA) suspension 30 mL  30 mL Oral Daily PRN Laveda Abbe, NP      . traZODone (DESYREL) tablet 100 mg  100 mg Oral QHS,MR X 1 Nira Conn A, NP   100 mg at 01/14/18 2132   Lab Results:  Results for orders placed or performed during the hospital encounter of 01/13/18 (from the past 48 hour(s))  TSH     Status: None   Collection Time: 01/15/18  6:45 AM  Result Value Ref Range   TSH 2.269 0.350 - 4.500 uIU/mL    Comment: Performed by a 3rd Generation assay with a functional sensitivity of <=0.01 uIU/mL. Performed at [facility], [address], [place], [place] [address]   Hemoglobin A1c     Status: None   Collection Time: 01/15/18  6:45 AM  Result Value Ref Range   Hgb A1c MFr Bld 5.6 4.8 - 5.6 %    Comment: (NOTE) Pre  diabetes:          5.7%-6.4% Diabetes:              >6.4% Glycemic control for   <7.0% adults with diabetes    Mean Plasma Glucose 114.02 mg/dL    Comment: Performed at [facility], [address] [place], [place] [address]  Lipid panel     Status: Abnormal   Collection Time: 01/15/18  6:45 AM  Result Value Ref Range   Cholesterol 213 (H) 0 - 200 mg/dL  Triglycerides 221 (H) <150 mg/dL   HDL 60 >16 mg/dL   Total CHOL/HDL Ratio 3.6 RATIO   VLDL 44 (H) 0 - 40 mg/dL   LDL Cholesterol 109 (H) 0 - 99 mg/dL    Comment:        Total Cholesterol/HDL:CHD Risk Coronary Heart Disease Risk Table                     Men   Women  1/2 Average Risk   3.4   3.3  Average Risk       5.0   4.4  2 X Average Risk   9.6   7.1  3 X Average Risk  23.4   11.0        Use the calculated Patient Ratio above and the CHD Risk Table to determine the patient's CHD Risk.        ATP III CLASSIFICATION (LDL):  <100     mg/dL   Optimal  604-540  mg/dL   Near or Above                    Optimal  130-159  mg/dL   Borderline  981-191  mg/dL   High  >478     mg/dL   Very High Performed at [facility], [address], [place], [place] [address]     Blood Alcohol level:  Lab Results  Component Value Date   ETH <10 01/10/2018   ETH <5 08/31/2016   Metabolic Disorder Labs: Lab Results  Component Value Date   HGBA1C 5.6 01/15/2018   MPG 114.02 01/15/2018   No results found for: PROLACTIN Lab Results  Component Value Date   CHOL 213 (H) 01/15/2018   TRIG 221 (H) 01/15/2018   HDL 60 01/15/2018   CHOLHDL 3.6 01/15/2018   VLDL 44 (H) 01/15/2018   LDLCALC 109 (H) 01/15/2018   Physical Findings: AIMS:  , ,  ,  ,    CIWA:    COWS:     Musculoskeletal: Strength & Muscle Tone: within normal limits Gait & Station: normal Patient leans: N/A  Psychiatric Specialty Exam: Physical Exam  Nursing note and vitals reviewed.   Review of Systems  Psychiatric/Behavioral:  Positive for hallucinations (Hx. Psychosis). Negative for depression, substance abuse and suicidal ideas. The patient is not nervous/anxious and does not have insomnia.     Blood pressure 124/81, pulse 75, temperature 98.4 F (36.9 C), temperature source Oral, resp. rate 16, height 6' (1.829 m), weight 87.5 kg (193 lb), SpO2 98 %.Body mass index is 26.18 kg/m.  General Appearance: Disheveled  Eye Contact:  Fair  Speech:  Clear and Coherent and Normal Rate  Volume:  Normal  Mood:  Euthymic  Affect:  Flat  Thought Process:  Coherent, Disorganized and Descriptions of Associations: Loose  Orientation:  Full (Time, Place, and Person)  Thought Content:  Delusions, Hallucinations: Auditory and Tangential  Suicidal Thoughts:  No  Homicidal Thoughts:  No  Memory:  Immediate;   Fair Recent;   Fair Remote;   Fair  Judgement:  Poor  Insight:  Lacking  Psychomotor Activity:  Normal  Concentration:  Concentration: Fair  Recall:  [employer] of Knowledge:  Fair  Language:  Fair  Akathisia:  No  Handed:    AIMS (if indicated):     Assets:  Resilience Social Support  ADL's:  Intact  Cognition:  WNL  Sleep:  Number of Hours  Treatment Plan Summary: Daily contact with patient to assess and evaluate symptoms and progress in treatment.  - Continue inpatient hospitalization.  - Will continue today 01/15/2018 plan as below except where it is noted.  Mood control     - Continue fluphenazine (prolixin) 5 mg po bid.  EPS.     - Continue Benztropine 0.5 mg po bid.  Anxiety.      - Continue Hydroxyzine 50 mg po prn Q 6 hours prn.  Agitation/Psychosis.      - Continue Olanzapine Zydis disintegrating tablet 10 mg Q 8 hrs prn      & Ativan 1 mg po as needed for 1 dose.     & Geodon 20 mg IM prn for 1 dose.  Insomnia.     - Continue Trazodone 100 mg po Q hs prn, (may repeat x 1).  Asthma.     - Continue Albuterol inhaler 108 (90 base) 2 puffs Q 6 hrs prn per inhalation.  Encourage  patient to attend & participate in the group counseling sessions.  The discharge disposition plan is ongoing.  Armandina Stammer, NP, PMHNP, FNP-BC 01/15/2018, 2:41 PM

## 2018-01-15 NOTE — Progress Notes (Signed)
Recreation Therapy Notes  Date: 6.26.19 Time: 1000 Location: 500 Hall Dayroom  Group Topic: Leisure Education  Goal Area(s) Addresses:  Patient will identify positive leisure activities.  Patient will identify one positive benefit of participation in leisure activities.   Intervention: To introduce patients to various leisure activities that can be used for entertainment, stress relief or coping skills.  Activity: Pictionary.  Patients were to select a slip of paper from the container.  Each slip of paper had an activity on it.  Patients were to draw the a picture of what was on the paper on the board.  The remaining patients were to guess what the picture was.  The person that guesses correctly, gets the next turn.  Education:  Leisure Education, Building control surveyorDischarge Planning  Education Outcome: Acknowledges education/In group clarification offered/Needs additional education  Clinical Observations/Feedback: Pt did not attend group.     Caroll RancherMarjette Booker Bhatnagar, LRT/CTRS     Lillia AbedLindsay, Debarah Mccumbers A 01/15/2018 11:51 AM

## 2018-01-15 NOTE — Progress Notes (Signed)
When pt was taking his medications last night, pt dropped them out his mouth and writer had to pull more out the [employer]

## 2018-01-15 NOTE — Tx Team (Signed)
Interdisciplinary Treatment and Diagnostic Plan Update  01/15/2018 Time of Session: 0830AM Curtis Davenport MRN: [id number]  Principal Diagnosis: Schizophrenia, paranoid, chronic (HCC)  Secondary Diagnoses: Principal Problem:   Schizophrenia, paranoid, chronic (HCC)   Current Medications:  Current Facility-Administered Medications  Medication Dose Route Frequency Provider Last Rate Last Dose  . acetaminophen (TYLENOL) tablet 650 mg  650 mg Oral Q6H PRN Laveda Abbe, NP      . albuterol (PROVENTIL HFA;VENTOLIN HFA) 108 (90 Base) MCG/ACT inhaler 2 puff  2 puff Inhalation Q6H PRN Laveda Abbe, NP      . alum & mag hydroxide-simeth (MAALOX/MYLANTA) 200-200-20 MG/5ML suspension 30 mL  30 mL Oral Q4H PRN Laveda Abbe, NP      . benztropine (COGENTIN) tablet 0.5 mg  0.5 mg Oral BID Laveda Abbe, NP   0.5 mg at 01/15/18 0944  . fluPHENAZine (PROLIXIN) tablet 5 mg  5 mg Oral BID Micheal Likens, MD   5 mg at 01/15/18 0944  . hydrOXYzine (ATARAX/VISTARIL) tablet 50 mg  50 mg Oral Q6H PRN Micheal Likens, MD   50 mg at 01/14/18 2131  . OLANZapine zydis (ZYPREXA) disintegrating tablet 10 mg  10 mg Oral Q8H PRN Micheal Likens, MD   10 mg at 01/15/18 1203   And  . LORazepam (ATIVAN) tablet 1 mg  1 mg Oral PRN Micheal Likens, MD       And  . ziprasidone (GEODON) injection 20 mg  20 mg Intramuscular PRN Micheal Likens, MD      . magnesium hydroxide (MILK OF MAGNESIA) suspension 30 mL  30 mL Oral Daily PRN Laveda Abbe, NP      . traZODone (DESYREL) tablet 100 mg  100 mg Oral QHS,MR X 1 Nira Conn A, NP   100 mg at 01/14/18 2132   PTA Medications: Medications Prior to Admission  Medication Sig Dispense Refill Last Dose  . albuterol (PROVENTIL HFA;VENTOLIN HFA) 108 (90 BASE) MCG/ACT inhaler Inhale 2 puffs into the lungs every 6 (six) hours as needed. For shortness of breath   > month  . ARIPiprazole (ABILIFY) 5 MG  tablet Take 1 tablet (5 mg total) by mouth daily. 30 tablet 0 01/09/2018 at Unknown time    Patient Stressors: Financial difficulties Medication change or noncompliance  Patient Strengths: Average or above average intelligence  Treatment Modalities: Medication Management, Group therapy, Case management,  1 to 1 session with clinician, Psychoeducation, Recreational therapy.   Physician Treatment Plan for Primary Diagnosis: Schizophrenia, paranoid, chronic (HCC) Long Term Goal(s): Improvement in symptoms so as ready for discharge Improvement in symptoms so as ready for discharge   Short Term Goals: Ability to identify and develop effective coping behaviors will improve Ability to identify changes in lifestyle to reduce recurrence of condition will improve  Medication Management: Evaluate patient's response, side effects, and tolerance of medication regimen.  Therapeutic Interventions: 1 to 1 sessions, Unit Group sessions and Medication administration.  Evaluation of Outcomes: Progressing  Physician Treatment Plan for Secondary Diagnosis: Principal Problem:   Schizophrenia, paranoid, chronic (HCC)  Long Term Goal(s): Improvement in symptoms so as ready for discharge Improvement in symptoms so as ready for discharge   Short Term Goals: Ability to identify and develop effective coping behaviors will improve Ability to identify changes in lifestyle to reduce recurrence of condition will improve     Medication Management: Evaluate patient's response, side effects, and tolerance of medication regimen.  Therapeutic Interventions: 1 to 1  sessions, Unit Group sessions and Medication administration.  Evaluation of Outcomes: Progressing   RN Treatment Plan for Primary Diagnosis: Schizophrenia, paranoid, chronic (HCC) Long Term Goal(s): Knowledge of disease and therapeutic regimen to maintain health will improve  Short Term Goals: Ability to remain free from injury will improve, Ability  to verbalize frustration and anger appropriately will improve, Ability to demonstrate self-control and Ability to identify and develop effective coping behaviors will improve  Medication Management: RN will administer medications as ordered by provider, will assess and evaluate patient's response and provide education to patient for prescribed medication. RN will report any adverse and/or side effects to prescribing provider.  Therapeutic Interventions: 1 on 1 counseling sessions, Psychoeducation, Medication administration, Evaluate responses to treatment, Monitor vital signs and CBGs as ordered, Perform/monitor CIWA, COWS, AIMS and Fall Risk screenings as ordered, Perform wound care treatments as ordered.  Evaluation of Outcomes: Progressing   LCSW Treatment Plan for Primary Diagnosis: Schizophrenia, paranoid, chronic (HCC) Long Term Goal(s): Safe transition to appropriate next level of care at discharge, Engage patient in therapeutic group addressing interpersonal concerns.  Short Term Goals: Engage patient in aftercare planning with referrals and resources, Increase ability to appropriately verbalize feelings, Increase emotional regulation and Increase skills for wellness and recovery  Therapeutic Interventions: Assess for all discharge needs, 1 to 1 time with Social worker, Explore available resources and support systems, Assess for adequacy in community support network, Educate family and significant other(s) on suicide prevention, Complete Psychosocial Assessment, Interpersonal group therapy.  Evaluation of Outcomes: Progressing   Progress in Treatment: Attending groups: Yes. Participating in groups: Yes. Taking medication as prescribed: Yes. Toleration medication: Yes. Family/Significant other contact made: Contact attempts made with pt's mother. CSW seeking collateral information.  Patient understands diagnosis: No. poor insight; poor historian.  Discussing patient identified  problems/goals with staff: Yes. Medical problems stabilized or resolved: Yes. Denies suicidal/homicidal ideation: Yes. Issues/concerns per patient self-inventory: No. Other: n/a   New problem(s) identified: No, Describe:  n/a  New Short Term/Long Term Goal(s): reduction in symptoms of psychosis/delusions and aggression, medication management for mood stabilization; elimination of SI thoughts; development of comprehensive mental wellness/sobriety plan.   Patient Goals:  "I'm not sure why I'm here. I need housing."   Discharge Plan or Barriers: CSW assessing. Pt has follow-up at [facility] for medication management. [facility] pamphlet, Mobile Crisis information, and AA/NA information provided to patient for additional community support and resources.   Reason for Continuation of Hospitalization: Aggression Delusions  Hallucinations Mania Medication stabilization  Estimated Length of Stay: Monday, 01/20/18  Attendees: Patient: Curtis Davenport 01/15/2018 2:29 PM  Physician: Dr. Altamese Carolinaainville MD; Dr. Jama Flavorsobos MD 01/15/2018 2:29 PM  Nursing: Marton Redwoodoni RN; Alyssa RN 01/15/2018 2:29 PM  RN Care Manager:x 01/15/2018 2:29 PM  Social Worker: Corrie MckusickHeather Donis Pinder LCSW 01/15/2018 2:29 PM  Recreational Therapist: x 01/15/2018 2:29 PM  Other: Armandina StammerAgnes Nwoko NP 01/15/2018 2:29 PM  Other:  01/15/2018 2:29 PM  Other: 01/15/2018 2:29 PM    Scribe for Treatment Team: Rona RavensHeather S Juanito Gonyer, LCSW 01/15/2018 2:29 PM

## 2018-01-15 NOTE — Progress Notes (Signed)
Patient self inventory- Patient slept fair last night, appetite has been fair, energy level normal, concentration is good. Depression rated 5 out of 10, hopelessness being 5 out of 10, and anxiety 5 out of 10. Denies withdrawal symptoms. Denies physical pain, Denies SI HI AVH. Patient's goal is "being an able body." and he will do this by "thinking it through." Denies SI HI AVH.  Patient compliant with medication administration.  Safety maintained with 15 minute checks as well as environmental checks. Will continue to monitor.

## 2018-01-16 LAB — PROLACTIN: PROLACTIN: 44.7 ng/mL — AB (ref 4.0–15.2)

## 2018-01-16 NOTE — Progress Notes (Signed)
Adult Psychoeducational Group Note  Date:  01/16/2018 Time:  8:42 PM  Group Topic/Focus:  Wrap-Up Group:   The focus of this group is to help patients review their daily goal of treatment and discuss progress on daily workbooks.  Participation Level:  Active  Participation Quality:  Appropriate  Affect:  Appropriate  Cognitive:  Appropriate  Insight: Appropriate  Engagement in Group:  Engaged  Modes of Intervention:  Discussion  Additional Comments:  The patient expressed that he rates today a 8.The patient also said that his day was up and down.  [name], Ashea Winiarski Lee 01/16/2018, 8:42 PM

## 2018-01-16 NOTE — Progress Notes (Signed)
[facility] MD Progress Note  01/16/2018 1:08 PM Curtis Davenport  MRN:  [id number]  Subjective: [name] reports, "I'm doing alright. I'm doing good on the medicines. I did not go to groups today. I chose not to go today. I'm hungry right now".  Curtis Davenport is a 27 y/o M with history of schizophrenia who was admitted from [facility] on IVC after being brought in by his mother with worsening symptoms of psychosis including responding to internal stimuli, not caring for his personal hygiene, and episodes of agitation including at his most recent outpatient mental health follow up appointment. Pt was medically cleared in the ED and then transferred to [facility] for additional treatment and stabilization. Upon initial interview, pt is vague, circumstantial, and minimizing. He is generally cooperative with the interview, but he is overall a poor historian. When asked why he was brought to the hospital, he shares, "A tough situation, trying to correct with my family.   Curtis Davenport is seen, chart reviewed. The chart findings discussed with the treatment team. He is alert, oriented to self. He presents today aloof, just like the previous day, disconnected & bizarre. He did not make any eye contact with the provider during this assessment. He starred straight to the wall while answering the assessment questions as if he is watching or looking at something. He says he did not attend any group sessions today. He says his reason is, he chose to not attend. He says he is doing well on his medications. Denies any adverse effects. Patient will continue to require inpatient hospitalization with the current plan of care already in progress.There are no changes made on his current plan of care today.  Principal Problem: Schizophrenia, paranoid, chronic (HCC)  Diagnosis:   Patient Active Problem List   Diagnosis Date Noted  . Schizophrenia, paranoid, chronic (HCC) [F20.0] 01/13/2018  . Psychosis (HCC) [F29]   . Paranoid schizophrenia (HCC) [F20.0]  01/10/2018  . Schizophrenia, undifferentiated (HCC) [F20.3]    Total Time spent with patient: 25 minutes  Past Psychiatric History: See H&P.  Past Medical History:  Past Medical History:  Diagnosis Date  . Hallucinations   . PTSD (post-traumatic stress disorder)   . Schizophrenia (HCC)    History reviewed. No pertinent surgical history.  Family History: History reviewed. No pertinent family history.  Family Psychiatric  History: see H&P.  Social History:  Social History   Substance and Sexual Activity  Alcohol Use Yes     Social History   Substance and Sexual Activity  Drug Use No    Social History   Socioeconomic History  . Marital status: Single    Spouse name: Not on file  . Number of children: Not on file  . Years of education: Not on file  . Highest education level: Not on file  Occupational History  . Not on file  Social Needs  . Financial resource strain: Not on file  . Food insecurity:    Worry: Not on file    Inability: Not on file  . Transportation needs:    Medical: Not on file    Non-medical: Not on file  Tobacco Use  . Smoking status: Current Some Day Smoker    Packs/day: 0.50  . Smokeless tobacco: Never Used  Substance and Sexual Activity  . Alcohol use: Yes  . Drug use: No  . Sexual activity: Yes  Lifestyle  . Physical activity:    Days per week: Not on file    Minutes per session: Not on  file  . Stress: Not on file  Relationships  . Social connections:    Talks on phone: Not on file    Gets together: Not on file    Attends religious service: Not on file    Active member of club or organization: Not on file    Attends meetings of clubs or organizations: Not on file    Relationship status: Not on file  Other Topics Concern  . Not on file  Social History Narrative  . Not on file   Additional Social History:   Sleep: Good  Appetite:  Good  Current Medications: Current Facility-Administered Medications  Medication Dose Route  Frequency Provider Last Rate Last Dose  . acetaminophen (TYLENOL) tablet 650 mg  650 mg Oral Q6H PRN Laveda Abbe, NP   650 mg at 01/15/18 2052  . albuterol (PROVENTIL HFA;VENTOLIN HFA) 108 (90 Base) MCG/ACT inhaler 2 puff  2 puff Inhalation Q6H PRN Laveda Abbe, NP      . alum & mag hydroxide-simeth (MAALOX/MYLANTA) 200-200-20 MG/5ML suspension 30 mL  30 mL Oral Q4H PRN Laveda Abbe, NP      . benztropine (COGENTIN) tablet 0.5 mg  0.5 mg Oral BID Laveda Abbe, NP   0.5 mg at 01/16/18 0740  . fluPHENAZine (PROLIXIN) tablet 5 mg  5 mg Oral BID Micheal Likens, MD   5 mg at 01/16/18 0740  . hydrOXYzine (ATARAX/VISTARIL) tablet 50 mg  50 mg Oral Q6H PRN Micheal Likens, MD   50 mg at 01/14/18 2131  . OLANZapine zydis (ZYPREXA) disintegrating tablet 10 mg  10 mg Oral Q8H PRN Micheal Likens, MD   10 mg at 01/15/18 1203   And  . LORazepam (ATIVAN) tablet 1 mg  1 mg Oral PRN Micheal Likens, MD       And  . ziprasidone (GEODON) injection 20 mg  20 mg Intramuscular PRN Micheal Likens, MD      . magnesium hydroxide (MILK OF MAGNESIA) suspension 30 mL  30 mL Oral Daily PRN Laveda Abbe, NP      . traZODone (DESYREL) tablet 100 mg  100 mg Oral QHS,MR X 1 Nira Conn A, NP   100 mg at 01/15/18 2143   Lab Results:  Results for orders placed or performed during the hospital encounter of 01/13/18 (from the past 48 hour(s))  TSH     Status: None   Collection Time: 01/15/18  6:45 AM  Result Value Ref Range   TSH 2.269 0.350 - 4.500 uIU/mL    Comment: Performed by a 3rd Generation assay with a functional sensitivity of <=0.01 uIU/mL. Performed at [facility], [address], [place], [place] [address]   Prolactin     Status: Abnormal   Collection Time: 01/15/18  6:45 AM  Result Value Ref Range   Prolactin 44.7 (H) 4.0 - 15.2 ng/mL    Comment: (NOTE) Performed At: [facility] [address] [place], [place] [id number] Jolene Schimke MD [id number] Performed at [facility], [address], [place], [place] [address]   Hemoglobin A1c     Status: None   Collection Time: 01/15/18  6:45 AM  Result Value Ref Range   Hgb A1c MFr Bld 5.6 4.8 - 5.6 %    Comment: (NOTE) Pre diabetes:          5.7%-6.4% Diabetes:              >6.4% Glycemic  control for   <7.0% adults with diabetes    Mean Plasma Glucose 114.02 mg/dL    Comment: Performed at [facility], [address] [place], [place] [address]  Lipid panel     Status: Abnormal   Collection Time: 01/15/18  6:45 AM  Result Value Ref Range   Cholesterol 213 (H) 0 - 200 mg/dL   Triglycerides 604221 (H) <150 mg/dL   HDL 60 >54>40 mg/dL   Total CHOL/HDL Ratio 3.6 RATIO   VLDL 44 (H) 0 - 40 mg/dL   LDL Cholesterol 098109 (H) 0 - 99 mg/dL    Comment:        Total Cholesterol/HDL:CHD Risk Coronary Heart Disease Risk Table                     Men   Women  1/2 Average Risk   3.4   3.3  Average Risk       5.0   4.4  2 X Average Risk   9.6   7.1  3 X Average Risk  23.4   11.0        Use the calculated Patient Ratio above and the CHD Risk Table to determine the patient's CHD Risk.        ATP III CLASSIFICATION (LDL):  <100     mg/dL   Optimal  119-147100-129  mg/dL   Near or Above                    Optimal  130-159  mg/dL   Borderline  [id number]  mg/dL   High  >130>190     mg/dL   Very High Performed at [facility], [address] [place], [place] [address]     Blood Alcohol level:  Lab Results  Component Value Date   [facility]   ETH <5 08/31/2016   Metabolic Disorder Labs: Lab Results  Component Value Date   HGBA1C 5.6 01/15/2018   MPG 114.02 01/15/2018   Lab Results  Component Value Date   PROLACTIN 44.7 (H) 01/15/2018   Lab Results  Component Value Date   CHOL 213 (H) 01/15/2018   TRIG 221 (H) 01/15/2018   HDL 60 01/15/2018   CHOLHDL 3.6 01/15/2018   VLDL  44 (H) 01/15/2018   LDLCALC 109 (H) 01/15/2018   Physical Findings: AIMS:  , ,  ,  ,    CIWA:    COWS:     Musculoskeletal: Strength & Muscle Tone: within normal limits Gait & Station: normal Patient leans: N/A  Psychiatric Specialty Exam: Physical Exam  Nursing note and vitals reviewed.   Review of Systems  Psychiatric/Behavioral: Positive for hallucinations (Hx. Psychosis). Negative for depression, substance abuse and suicidal ideas. The patient is not nervous/anxious and does not have insomnia.     Blood pressure 122/77, pulse 85, temperature 98.2 F (36.8 C), temperature source Oral, resp. rate 16, height 6' (1.829 m), weight 87.5 kg (193 lb), SpO2 98 %.Body mass index is 26.18 kg/m.  General Appearance: Disheveled  Eye Contact:  Fair  Speech:  Clear and Coherent and Normal Rate  Volume:  Normal  Mood:  Euthymic  Affect:  Flat  Thought Process:  Coherent, Disorganized and Descriptions of Associations: Loose  Orientation:  Full (Time, Place, and Person)  Thought Content:  Delusions, Hallucinations: Auditory and Tangential  Suicidal Thoughts:  No  Homicidal Thoughts:  No  Memory:  Immediate;   Fair Recent;  Fair Remote;   Fair  Judgement:  Poor  Insight:  Lacking  Psychomotor Activity:  Normal  Concentration:  Concentration: Fair  Recall:  [employer] of Knowledge:  Fair  Language:  Fair  Akathisia:  No  Handed:    AIMS (if indicated):     Assets:  Resilience Social Support  ADL's:  Intact  Cognition:  WNL  Sleep:  Number of Hours     Treatment Plan Summary: Daily contact with patient to assess and evaluate symptoms and progress in treatment.  - Continue inpatient hospitalization.  - Will continue today 01/16/2018 plan as below except where it is noted.  Mood control     - Continue fluphenazine (prolixin) 5 mg po bid.  EPS.     - Continue Benztropine 0.5 mg po bid.  Anxiety.      - Continue Hydroxyzine 50 mg po prn Q 6 hours  prn.  Agitation/Psychosis.      - Continue Olanzapine Zydis disintegrating tablet 10 mg Q 8 hrs prn      & Ativan 1 mg po as needed for 1 dose.     & Geodon 20 mg IM prn for 1 dose.  Insomnia.     - Continue Trazodone 100 mg po Q hs prn, (may repeat x 1).  Asthma.     - Continue Albuterol inhaler 108 (90 base) 2 puffs Q 6 hrs prn per inhalation.  Encourage patient to attend & participate in the group counseling sessions.  The discharge disposition plan is ongoing.  Armandina Stammer, NP, PMHNP, FNP-BC 01/16/2018, 1:08 PMPatient ID: Curtis Davenport, male   DOB: July 26, 1990, 27 y.o.   MRN: [id number]

## 2018-01-16 NOTE — Progress Notes (Signed)
Patient self inventory- Patient slept fair last night, sleep medication was not requested. Appetite has been fair, energy level normal, and concentration poor. Patient rates depression, hopelessness, and anxiety all 5 out of 10, with 10 being the highest. Denies SI HI AVH. Denies physical problems. Patient rates pain at a 5 out of 10, but said "none" when asked to specify. Patient's goal is to "focus" and to meet his goal he will "watch and learn."  Patient compliant with medication administration. Safety maintained with 15 minute checks as well as environmental checks. Will continue to monitor.

## 2018-01-16 NOTE — Plan of Care (Signed)
  Problem: Education: Goal: Knowledge of [facility] Education information/materials will improve Outcome: Progressing Goal: Emotional status will improve Outcome: Progressing Goal: Mental status will improve Outcome: Progressing Goal: Verbalization of understanding the information provided will improve Outcome: Progressing   Problem: Activity: Goal: Interest or engagement in activities will improve Outcome: Progressing Goal: Sleeping patterns will improve Outcome: Progressing   Problem: Coping: Goal: Ability to verbalize frustrations and anger appropriately will improve Outcome: Progressing Goal: Ability to demonstrate self-control will improve Outcome: Progressing   Problem: Health Behavior/Discharge Planning: Goal: Compliance with treatment plan for underlying cause of condition will improve Outcome: Progressing   Problem: Physical Regulation: Goal: Ability to maintain clinical measurements within normal limits will improve Outcome: Progressing   Problem: Safety: Goal: Periods of time without injury will increase Outcome: Progressing

## 2018-01-16 NOTE — Plan of Care (Signed)
D: Pt denies SI/HI/AV hallucinations. Pt is pleasant and cooperative. Pt voices no concerns states he is sleepy. A: Pt was offered support and encouragement. Pt was given scheduled medications. Pt was encourage to attend groups. Q 15 minute checks were done for safety.  R:Pt attends groups and interacts well with peers and staff. Pt is taking medication. Pt has no complaints.Pt receptive to treatment and safety maintained on unit.    Problem: Safety: Goal: Periods of time without injury will increase Outcome: Progressing Note:  Patient denies SI and remains safe on unit.

## 2018-01-16 NOTE — Progress Notes (Signed)
Recreation Therapy Notes  Date: 6.27.19 Time: 0945 Location: [address] Dayroom  Group Topic: Leisure Education  Goal Area(s) Addresses:  Patient will identify positive leisure activities.  Patient will identify one positive benefit of participation in leisure activities.   Intervention: Music  Activity: Exercise.  LRT and patients participated various exercises in the dayroom to get the heart rate up and get the blood flowing.  Education:  Leisure Education, Building control surveyorDischarge Planning  Education Outcome: Acknowledges education/In group clarification offered/Needs additional education  Clinical Observations/Feedback:  Pt did not attend group.    Curtis RancherMarjette Hodan Davenport, Curtis Davenport    Curtis RancherLindsay, Curtis Davenport 01/16/2018 11:33 AM

## 2018-01-17 MED ORDER — IBUPROFEN 600 MG PO TABS
600.0000 mg | ORAL_TABLET | Freq: Four times a day (QID) | ORAL | Status: DC | PRN
  Administered 2018-01-17: 600 mg via ORAL
  Filled 2018-01-17: qty 1

## 2018-01-17 NOTE — Plan of Care (Signed)
Pt progressing in the following metrics.   D: pt found in bed this morning. Pt compliant with medication administration. Pt states he has head a back pain that he rates a 5/10. Pt rates his depression/hopelessness/anxiety all 5/10. Pt states his goal for today is "finace" and he will achieve this by "focusing". Pt denies any si/hi/ah/vh but appears to be responding to internal stimuli. Pt was looking out the window laughing. Pt does contract for safety and agrees to approach staff if si/hi become apparent.  A: pt provided support and encouragement. Pt provided medications not by this Clinical research associatewriter. Pt compliant. [id number] safety checks implemented and continued.  R: pt safe on the unit. Will continue to monitor.  Problem: Education: Goal: Mental status will improve Outcome: Progressing Goal: Verbalization of understanding the information provided will improve Outcome: Progressing   Problem: Activity: Goal: Sleeping patterns will improve Outcome: Progressing   Problem: Coping: Goal: Ability to demonstrate self-control will improve Outcome: Progressing   Problem: Health Behavior/Discharge Planning: Goal: Compliance with treatment plan for underlying cause of condition will improve Outcome: Progressing   Problem: Physical Regulation: Goal: Ability to maintain clinical measurements within normal limits will improve Outcome: Progressing   Problem: Safety: Goal: Periods of time without injury will increase Outcome: Progressing

## 2018-01-17 NOTE — Progress Notes (Signed)
Recreation Therapy Notes  Date: 6.28.19 Time: 1000 Location: 500 [address] Dayroom  Group Topic: Self-Esteem  Goal Area(s) Addresses:  Patient will successfully identify positive attributes about themselves.  Patient will successfully identify benefit of improved self-esteem.   Intervention: Markers, construction paper, scissors, glue sticks, colored pencils, magazines  Activity: Collage.  Patients were to use the supplies offered to create a collage that described them and highlighted their positive qualities.  Education:  Self-Esteem, Building control surveyorDischarge Planning.   Education Outcome: Acknowledges education/In group clarification offered/Needs additional education  Clinical Observations/Feedback: Pt did not attend group.      Caroll RancherMarjette Damel Querry, LRT/CTRS    Caroll RancherLindsay, Hazem Kenner A 01/17/2018 12:59 PM

## 2018-01-17 NOTE — [facility] Group Notes (Signed)
Date/Time: 01/17/18, 1315 Type: Group therapy: Hope is... Participation level:Did not attend Description:  Chaplain Matt led group in discussion regarding what hope means for each individual patient.  Chaplain also provided pictures with different images and asked each pt to pick a picture that was meaningful to them in terms of being hopeful.  Summary of progress:   Curtis Davenport, MSW, LCSW Clinical Social Worker 01/17/2018 1:52 PM  

## 2018-01-17 NOTE — Progress Notes (Signed)
[facility] MD Progress Note  01/17/2018 4:34 PM Curtis Davenport  MRN:  [id number] Subjective:    Curtis Davenport is a 27 y/o M with history of schizophrenia who was admitted from [facility] on IVC after being brought in by his mother with worsening symptoms of psychosis including responding to internal stimuli, not caring for his personal hygiene, and episodes of agitation including at his most recent outpatient mental health follow up appointment. Pt was medically cleared in the ED and then transferred to [facility] for additional treatment and stabilization. He was started on trial of prolixin, and he dose has been titrated up during his stay. Pt has been reporting incremental improvement of his presenting symptoms.  Today upon evaluation, pt shares, "I'm doing good -just have a little back pain." Pt denies other physical complaints. He is sleeping well. His appetite is good. He denies SI/HI. He reports AH of "hearing God's voice" and he describes the [facility] as saying positive things. He denies VH. He is tolerating his medications well, and he feels that they have been helpful for calming his mood. Discussed with patient about option of transition to long-acting injectable form, and he declines at this time, stating he would prefer to stay on oral form. Pt is in agreement to continue his current regimen without changes. He had no further questions, comments, or concerns.   Principal Problem: Schizophrenia, paranoid, chronic (HCC) Diagnosis:   Patient Active Problem List   Diagnosis Date Noted  . Schizophrenia, paranoid, chronic (HCC) [F20.0] 01/13/2018  . Psychosis (HCC) [F29]   . Paranoid schizophrenia (HCC) [F20.0] 01/10/2018  . Schizophrenia, undifferentiated (HCC) [F20.3]    Total Time spent with patient: 30 minutes  Past Psychiatric History: see H&P  Past Medical History:  Past Medical History:  Diagnosis Date  . Hallucinations   . PTSD (post-traumatic stress disorder)   . Schizophrenia (HCC)    History  reviewed. No pertinent surgical history. Family History: History reviewed. No pertinent family history. Family Psychiatric  History: see H&P Social History:  Social History   Substance and Sexual Activity  Alcohol Use Yes     Social History   Substance and Sexual Activity  Drug Use No    Social History   Socioeconomic History  . Marital status: Single    Spouse name: Not on file  . Number of children: Not on file  . Years of education: Not on file  . Highest education level: Not on file  Occupational History  . Not on file  Social Needs  . Financial resource strain: Not on file  . Food insecurity:    Worry: Not on file    Inability: Not on file  . Transportation needs:    Medical: Not on file    Non-medical: Not on file  Tobacco Use  . Smoking status: Current Some Day Smoker    Packs/day: 0.50  . Smokeless tobacco: Never Used  Substance and Sexual Activity  . Alcohol use: Yes  . Drug use: No  . Sexual activity: Yes  Lifestyle  . Physical activity:    Days per week: Not on file    Minutes per session: Not on file  . Stress: Not on file  Relationships  . Social connections:    Talks on phone: Not on file    Gets together: Not on file    Attends religious service: Not on file    Active member of club or organization: Not on file    Attends meetings of clubs or  organizations: Not on file    Relationship status: Not on file  Other Topics Concern  . Not on file  Social History Narrative  . Not on file   Additional Social History:                         Sleep: Good  Appetite:  Good  Current Medications: Current Facility-Administered Medications  Medication Dose Route Frequency Provider Last Rate Last Dose  . acetaminophen (TYLENOL) tablet 650 mg  650 mg Oral Q6H PRN Laveda Abbe, NP   650 mg at 01/17/18 1205  . albuterol (PROVENTIL HFA;VENTOLIN HFA) 108 (90 Base) MCG/ACT inhaler 2 puff  2 puff Inhalation Q6H PRN Laveda Abbe,  NP      . alum & mag hydroxide-simeth (MAALOX/MYLANTA) 200-200-20 MG/5ML suspension 30 mL  30 mL Oral Q4H PRN Laveda Abbe, NP      . benztropine (COGENTIN) tablet 0.5 mg  0.5 mg Oral BID Laveda Abbe, NP   0.5 mg at 01/17/18 0749  . fluPHENAZine (PROLIXIN) tablet 5 mg  5 mg Oral BID Micheal Likens, MD   5 mg at 01/17/18 0749  . hydrOXYzine (ATARAX/VISTARIL) tablet 50 mg  50 mg Oral Q6H PRN Micheal Likens, MD   50 mg at 01/14/18 2131  . ibuprofen (ADVIL,MOTRIN) tablet 600 mg  600 mg Oral Q6H PRN Micheal Likens, MD      . OLANZapine zydis (ZYPREXA) disintegrating tablet 10 mg  10 mg Oral Q8H PRN Micheal Likens, MD   10 mg at 01/15/18 1203   And  . LORazepam (ATIVAN) tablet 1 mg  1 mg Oral PRN Micheal Likens, MD       And  . ziprasidone (GEODON) injection 20 mg  20 mg Intramuscular PRN Micheal Likens, MD      . magnesium hydroxide (MILK OF MAGNESIA) suspension 30 mL  30 mL Oral Daily PRN Laveda Abbe, NP      . traZODone (DESYREL) tablet 100 mg  100 mg Oral QHS,MR X 1 Nira Conn A, NP   100 mg at 01/16/18 2042    Lab Results: No results found for this or any previous visit (from the past 48 hour(s)).  Blood Alcohol level:  Lab Results  Component Value Date   ETH <10 01/10/2018   ETH <5 08/31/2016    Metabolic Disorder Labs: Lab Results  Component Value Date   HGBA1C 5.6 01/15/2018   MPG 114.02 01/15/2018   Lab Results  Component Value Date   PROLACTIN 44.7 (H) 01/15/2018   Lab Results  Component Value Date   CHOL 213 (H) 01/15/2018   TRIG 221 (H) 01/15/2018   HDL 60 01/15/2018   CHOLHDL 3.6 01/15/2018   VLDL 44 (H) 01/15/2018   LDLCALC 109 (H) 01/15/2018    Physical Findings: AIMS:  , ,  ,  ,    CIWA:    COWS:     Musculoskeletal: Strength & Muscle Tone: within normal limits Gait & Station: normal Patient leans: N/A  Psychiatric Specialty Exam: Physical Exam  Nursing note and  vitals reviewed.   Review of Systems  Constitutional: Negative for chills and fever.  Respiratory: Negative for cough and shortness of breath.   Cardiovascular: Negative for chest pain.  Gastrointestinal: Negative for abdominal pain, heartburn, nausea and vomiting.  Psychiatric/Behavioral: Positive for hallucinations. Negative for depression and suicidal ideas. The patient is not nervous/anxious and does not have  insomnia.     Blood pressure 130/75, pulse 79, temperature 98.8 F (37.1 C), temperature source Oral, resp. rate 16, height 6' (1.829 m), weight 87.5 kg (193 lb), SpO2 98 %.Body mass index is 26.18 kg/m.  General Appearance: Casual and Fairly Groomed  Eye Contact:  Good  Speech:  Clear and Coherent and Normal Rate  Volume:  Normal  Mood:  Euthymic  Affect:  Appropriate, Congruent and Flat  Thought Process:  Coherent and Goal Directed  Orientation:  Full (Time, Place, and Person)  Thought Content:  Hallucinations: Auditory  Suicidal Thoughts:  No  Homicidal Thoughts:  No  Memory:  Immediate;   Fair Recent;   Fair Remote;   Fair  Judgement:  Poor  Insight:  Lacking  Psychomotor Activity:  Normal  Concentration:  Concentration: Fair  Recall:  [employer]Fair  Fund of Knowledge:  Fair  Language:  Fair  Akathisia:  No  Handed:    AIMS (if indicated):     Assets:  Resilience Social Support  ADL's:  Intact  Cognition:  WNL  Sleep:  Number of Hours: 6.75   Treatment Plan Summary: Daily contact with patient to assess and evaluate symptoms and progress in treatment and Medication management   - Continue inpatient hospitalization.  -Schizophrenia - Continue fluphenazine (prolixin) 5 mg po bid.  -EPS.     - Continue Benztropine 0.5 mg po bid.  -Anxiety. - Continue Hydroxyzine 50 mg po prn Q 6 hours prn.  -Agitation/Psychosis. - Continue Olanzapine Zydis disintegrating tablet 10 mg Q 8 hrs prn  &Ativan 1 mg po as needed for 1 dose. &Geodon 20 mg  IM prn for 1 dose.  -Insomnia. - Continue Trazodone 100 mg po Q hs prn, (may repeat x 1).  Asthma.     - Continue Albuterol inhaler 108 (90 base) 2 puffs Q 6 hrs prn per inhalation.  -Encourage patient to attend & participate in the group counseling sessions.  -The discharge disposition plan is ongoing.  Micheal Likenshristopher T Lynnzie Blackson, MD 01/17/2018, 4:34 PM

## 2018-01-17 NOTE — Progress Notes (Signed)
Did not attend group 

## 2018-01-18 MED ORDER — LIDOCAINE 5 % EX PTCH
1.0000 | MEDICATED_PATCH | CUTANEOUS | Status: DC
  Administered 2018-01-18 – 2018-01-20 (×3): 1 via TRANSDERMAL
  Filled 2018-01-18 (×7): qty 1

## 2018-01-18 NOTE — Plan of Care (Signed)
Curtis Davenport has been sleeping well on the unit.  We will continue to monitor the progress towards his goals.

## 2018-01-18 NOTE — Plan of Care (Signed)
D: Pt denies SI/HI/AVH. Pt is pleasant and cooperative. Pt appeared less intrusive on the unit this evening, pt still presents with inappropriate smiling at times  A: Pt was offered support and encouragement. Pt was given scheduled medications. Pt was encourage to attend groups. Q 15 minute checks were done for safety.   R:Pt attends groups and interacts well with peers and staff. Pt is taking medication. Pt has no complaints.Pt receptive to treatment and safety maintained on unit.   Problem: Activity: Goal: Interest or engagement in activities will improve Outcome: Progressing   Problem: Coping: Goal: Ability to demonstrate self-control will improve Outcome: Progressing   Problem: Safety: Goal: Periods of time without injury will increase Outcome: Progressing

## 2018-01-18 NOTE — Progress Notes (Signed)
Adult Psychoeducational Group Note  Date:  01/18/2018 Time:  9:24 PM  Group Topic/Focus:  Wrap-Up Group:   The focus of this group is to help patients review their daily goal of treatment and discuss progress on daily workbooks.  Participation Level:  Active  Participation Quality:  Appropriate  Affect:  Appropriate  Cognitive:  Appropriate  Insight: Appropriate  Engagement in Group:  Engaged  Modes of Intervention:  Discussion  Additional Comments: The patient expressed that he attended groups and rates today a 8.  [name], Zeplin Aleshire Lee 01/18/2018, 9:24 PM

## 2018-01-18 NOTE — Plan of Care (Signed)
Pt progressing in the following metrics  D: pt found in the hallway this morning. Pt compliant with medication pass. Pt wrote that he slept poor but stated to this writer that he slept well. Pt denies any physical pain but a lidocaine patch has been ordered for his acute back pain that persisted through last night. Pt rates his depression/hopelessness/anxiety all a 5/10. Pt's goal is illegible. Pt denies any si/hi/ah/vh and verbally agrees to approach staff if these become apparent. Pt seems to be responding to stimuli by laughing when he is alone.  A: pt provided support and encouragement. Pt provided medications not by this Clinical research associatewriter. [id number] safety checks implemented and continued.  R: pt safe on the unit. Will continue to monitor.  Problem: Education: Goal: Emotional status will improve Outcome: Progressing Goal: Verbalization of understanding the information provided will improve Outcome: Progressing   Problem: Activity: Goal: Interest or engagement in activities will improve Outcome: Progressing   Problem: Coping: Goal: Ability to verbalize frustrations and anger appropriately will improve Outcome: Progressing Goal: Ability to demonstrate self-control will improve Outcome: Progressing   Problem: Physical Regulation: Goal: Ability to maintain clinical measurements within normal limits will improve Outcome: Progressing   Problem: Safety: Goal: Periods of time without injury will increase Outcome: Progressing   Problem: Education: Goal: Will be free of psychotic symptoms Outcome: Progressing   Problem: Health Behavior/Discharge Planning: Goal: Compliance with prescribed medication regimen will improve Outcome: Progressing   Problem: Role Relationship: Goal: Ability to interact with others will improve Outcome: Progressing

## 2018-01-18 NOTE — [facility] Group Notes (Signed)
  [facility] LCSW Group Therapy Note  Date/Time:  01/18/2018 11:15AM-12:00PM  Type of Therapy and Topic:  Group Therapy:  Feelings About Hospitalization  Participation Level:  Active   Description of Group This process group involved patients discussing their feelings related to being hospitalized, as well as the benefits they see to being in the hospital.  These feelings and benefits were itemized.  The group then brainstormed specific ways in which they could seek those same benefits when they discharge and return home.  Therapeutic Goals 1. Patient will identify and describe positive and negative feelings related to hospitalization 2. Patient will verbalize benefits of hospitalization to themselves personally 3. Patients will brainstorm together ways they can obtain similar benefits in the outpatient setting, identify barriers to wellness and possible solutions  Summary of Patient Progress:  The patient expressed his primary feelings about being hospitalized are that he really does not understand being here involuntarily, saying the sheriff had delivered paperwork about this, and he thinks it means that the people who put him here have to go to court.  He said it is proving helpful however.  He listened attentively to the discussion and seemed amused by a verbal altercation between two other patients.  He was particularly interested in the discussion about sleep hygiene but did not share why.  Therapeutic Modalities Cognitive Behavioral Therapy Motivational Interviewing    Ambrose MantleMareida Grossman-Orr, LCSW 01/18/2018, 8:52 AM

## 2018-01-18 NOTE — [facility] Group Notes (Signed)
[facility] Group Notes:  (Nursing/MHT/Case Management/Adjunct)  Date:  01/18/2018  Time:  1:50 PM  Type of Therapy:  Psychoeducational Skills  Participation Level:  Did Not Attend  Summary of Progress/Problems: Did a guided meditation group where participants were asked to focus on their breathing, followed by a "walk through the forest." Patient did not attend group.  Curtis MirzaJonathan C Kyran Davenport 01/18/2018, 1:50 PM

## 2018-01-18 NOTE — Progress Notes (Signed)
D:  Curtis Davenport has been in his room all evening.  He did come out to take his HS medications.  He denied SI/HI or A/V hallucinations.  He was actively focused on the camera on the ceiling.  He had noticeable thought blocking.  He denied any pain or discomfort and appeared to be in no physical distress.   A:  1:1 with RN for support and encouragement.  Medications as ordered.  Q 15 minute checks maintained for safety.  Encouraged participation in group and unit activities.   R:  Curtis Davenport remains safe on the unit.  We will continue to monitor the progress towards his goals.

## 2018-01-18 NOTE — Progress Notes (Addendum)
[facility]  01/18/2018 2:53 PM Curtis Davenport  MRN:  [id number]  Evaluation: Curtis Davenport awake alert and oriented x3.  Patient presents guarded and flat but pleasant.  Patient has concerns with lower back pain during this assessment was offered a Lidoderm patch patient was agreeable to treatment.  Denies suicidal or homicidal ideations.  Denies auditory or visual hallucinations.  Patient observed laughing inappropriately at times while pacing the unit.  Reports a good appetite.  States he is resting well throughout the night.  Patient encouraged to attend daily group session.  Patient is  minimal with responses and appears to be minimizing symptoms during this assessment.  Support encouragement reassurance was provided.  History:Curtis Davenport is a 27 y/o M with history of schizophrenia who was admitted from [facility] on IVC after being brought in by his mother with worsening symptoms of psychosis including responding to internal stimuli, not caring for his personal hygiene, and episodes of agitation including at his most recent outpatient mental health follow up appointment. Pt was medically cleared in the ED [facility]. He was started on trial of prolixin, and he dose has been titrated up during his stay. Pt has been reporting incremental improvement of his presenting symptoms.    Principal Problem: Schizophrenia, paranoid, chronic (HCC) Diagnosis:   Patient Active Problem List   Diagnosis Date Noted  . Schizophrenia, paranoid, chronic (HCC) [F20.0] 01/13/2018  . Psychosis (HCC) [F29]   . Paranoid schizophrenia (HCC) [F20.0] 01/10/2018  . Schizophrenia, undifferentiated (HCC) [F20.3]    Total Time spent with patient: 30 minutes  Past Psychiatric History: see H&P  Past Medical History:  Past Medical History:  Diagnosis Date  . Hallucinations   . PTSD (post-traumatic stress disorder)   . Schizophrenia (HCC)    History reviewed. No  pertinent surgical history. Family History: History reviewed. No pertinent family history. Family Psychiatric  History: see H&P Social History:  Social History   Substance and Sexual Activity  Alcohol Use Yes     Social History   Substance and Sexual Activity  Drug Use No    Social History   Socioeconomic History  . Marital status: Single    Spouse name: Not on file  . Number of children: Not on file  . Years of education: Not on file  . Highest education level: Not on file  Occupational History  . Not on file  Social Needs  . Financial resource strain: Not on file  . Food insecurity:    Worry: Not on file    Inability: Not on file  . Transportation needs:    Medical: Not on file    Non-medical: Not on file  Tobacco Use  . Smoking status: Current Some Day Smoker    Packs/day: 0.50  . Smokeless tobacco: Never Used  Substance and Sexual Activity  . Alcohol use: Yes  . Drug use: No  . Sexual activity: Yes  Lifestyle  . Physical activity:    Days per week: Not on file    Minutes per session: Not on file  . Stress: Not on file  Relationships  . Social connections:    Talks on phone: Not on file    Gets together: Not on file    Attends religious service: Not on file    Active member of club or organization: Not on file    Attends meetings of clubs or organizations: Not on file    Relationship status: Not on  file  Other Topics Concern  . Not on file  Social History Narrative  . Not on file   Additional Social History:                         Sleep: Good  Appetite:  Good  Current Medications: Current Facility-Administered Medications  Medication Dose Route Frequency Provider Last Rate Last Dose  . acetaminophen (TYLENOL) tablet 650 mg  650 mg Oral Q6H PRN Laveda Abbe, NP   650 mg at 01/17/18 1859  . albuterol (PROVENTIL HFA;VENTOLIN HFA) 108 (90 Base) MCG/ACT inhaler 2 puff  2 puff Inhalation Q6H PRN Laveda Abbe, NP      .  alum & mag hydroxide-simeth (MAALOX/MYLANTA) 200-200-20 MG/5ML suspension 30 mL  30 mL Oral Q4H PRN Laveda Abbe, NP      . benztropine (COGENTIN) tablet 0.5 mg  0.5 mg Oral BID Laveda Abbe, NP   0.5 mg at 01/18/18 1610  . fluPHENAZine (PROLIXIN) tablet 5 mg  5 mg Oral BID Micheal Likens, MD   5 mg at 01/18/18 0728  . hydrOXYzine (ATARAX/VISTARIL) tablet 50 mg  50 mg Oral Q6H PRN Micheal Likens, MD   50 mg at 01/14/18 2131  . ibuprofen (ADVIL,MOTRIN) tablet 600 mg  600 mg Oral Q6H PRN Micheal Likens, MD   600 mg at 01/17/18 1639  . lidocaine (LIDODERM) 5 % 1 patch  1 patch Transdermal Q24H Oneta Rack, NP   1 patch at 01/18/18 0907  . OLANZapine zydis (ZYPREXA) disintegrating tablet 10 mg  10 mg Oral Q8H PRN Micheal Likens, MD   10 mg at 01/15/18 1203   And  . LORazepam (ATIVAN) tablet 1 mg  1 mg Oral PRN Micheal Likens, MD       And  . ziprasidone (GEODON) injection 20 mg  20 mg Intramuscular PRN Micheal Likens, MD      . magnesium hydroxide (MILK OF MAGNESIA) suspension 30 mL  30 mL Oral Daily PRN Laveda Abbe, NP      . traZODone (DESYREL) tablet 100 mg  100 mg Oral QHS,MR X 1 Nira Conn A, NP   100 mg at 01/17/18 2121    Lab Results: No results found for this or any previous visit (from the past 48 hour(s)).  Blood Alcohol level:  Lab Results  Component Value Date   ETH <10 01/10/2018   ETH <5 08/31/2016    Metabolic Disorder Labs: Lab Results  Component Value Date   HGBA1C 5.6 01/15/2018   MPG 114.02 01/15/2018   Lab Results  Component Value Date   PROLACTIN 44.7 (H) 01/15/2018   Lab Results  Component Value Date   CHOL 213 (H) 01/15/2018   TRIG 221 (H) 01/15/2018   HDL 60 01/15/2018   CHOLHDL 3.6 01/15/2018   VLDL 44 (H) 01/15/2018   LDLCALC 109 (H) 01/15/2018    Physical Findings: AIMS:  , ,  ,  ,    CIWA:    COWS:     Musculoskeletal: Strength & Muscle Tone: within  normal limits Gait & Station: normal Patient leans: N/A  Psychiatric Specialty Exam: Physical Exam  Nursing note and vitals reviewed.   Review of Systems  Psychiatric/Behavioral: Positive for hallucinations. Negative for depression and suicidal ideas. The patient is not nervous/anxious and does not have insomnia.   All other systems reviewed and are negative.   Blood pressure (!) 148/115, pulse  85, temperature 97.8 F (36.6 C), resp. rate 16, height 6' (1.829 m), weight 87.5 kg (193 lb), SpO2 98 %.Body mass index is 26.18 kg/m.  General Appearance: Casual and Fairly Groomed  Eye Contact:  Good  Speech:  Clear and Coherent and Normal Rate  Volume:  Normal  Mood:  Euthymic  Affect:  Appropriate, Congruent and Flat  Thought Process:  Coherent and Goal Directed  Orientation:  Full (Time, Place, and Person)  Thought Content:  Hallucinations: Auditory  Suicidal Thoughts:  No  Homicidal Thoughts:  No  Memory:  Immediate;   Fair Recent;   Fair Remote;   Fair  Judgement:  Poor  Insight:  Lacking  Psychomotor Activity:  Normal  Concentration:  Concentration: Fair  Recall:  [employer] of Knowledge:  Fair  Language:  Fair  Akathisia:  No  Handed:    AIMS (if indicated):     Assets:  Resilience Social Support  ADL's:  Intact  Cognition:  WNL  Sleep:  Number of Hours: 6.75   Treatment Plan Summary: Daily contact with patient to assess and evaluate symptoms and progress in treatment and Medication management   Continue with current treatment plan as listed below on 01/18/2018 except were noted  -Schizophrenia - Continue fluphenazine (prolixin) 5 mg po bid.  -EPS.     - Continue Benztropine 0.5 mg po bid.  -Anxiety. - Continue Hydroxyzine 50 mg po prn Q 6 hours prn.  -Agitation/Psychosis. - Continue Olanzapine Zydis disintegrating tablet 10 mg Q 8 hrs prn  &Ativan 1 mg po as needed for 1 dose. &Geodon 20 mg IM prn for 1  dose.  -Insomnia. - Continue Trazodone 100 mg po Q hs prn, (may repeat x 1).  Asthma.     - Continue Albuterol inhaler 108 (90 base) 2 puffs Q 6 hrs prn per inhalation.  Back pain:  Initiated Lidoderm patch 5% topical every 12 as needed    -Encourage patient to attend & participate in the group counseling sessions.  -The discharge disposition plan is ongoing.  Oneta Rack, NP 01/18/2018, 2:53 PM    ..Agree with NP Progress Note

## 2018-01-19 NOTE — [facility] Group Notes (Signed)
[facility] Group Notes: (Clinical Social Work)   01/19/2018      Type of Therapy:  Group Therapy   Participation Level:  Did Not Attend despite MHT prompting   Avonlea Sima Grossman-Orr, LCSW 01/19/2018, 12:14 PM     

## 2018-01-19 NOTE — Plan of Care (Signed)
D: Pt denies SI/HI/AVH. Pt is pleasant and cooperative. Pt kept to himself this evening, pt appears to be responding less than yesterday. Pt had less inappropriate smiling.   A: Pt was offered support and encouragement. Pt was given scheduled medications. Pt was encourage to attend groups. Q 15 minute checks were done for safety.   R: safety maintained on unit.  Problem: Coping: Goal: Ability to verbalize frustrations and anger appropriately will improve Outcome: Progressing   Problem: Coping: Goal: Ability to demonstrate self-control will improve Outcome: Progressing   Problem: Education: Goal: Will be free of psychotic symptoms Outcome: Progressing

## 2018-01-19 NOTE — Progress Notes (Signed)
DAR NOTE: Pt present with flat affect and depressed mood in the unit. Pt has been isolating himself and has been bed most of the time. Pt denies physical pain, took all his meds as scheduled. As per self inventory, pt had a fair night sleep, fair appetite, normal energy, and poor concentration. Pt rate depression at 5, hopeless ness at 5, and anxiety at 5. Pt's safety ensured with 15 minute and environmental checks. Pt currently denies SI/HI and A/V hallucinations. Pt verbally agrees to seek staff if SI/HI or A/VH occurs and to consult with staff before acting on these thoughts. Will continue POC.

## 2018-01-19 NOTE — [facility] Group Notes (Signed)
[facility] Group Notes:  (Nursing/MHT/Case Management/Adjunct)  Date:  01/19/2018  Time:  3:22 PM  Type of Therapy:  Psychoeducational Skills  Participation Level:  Did Not Attend  Participation Quality:  DID NOT ATTEND  Affect:  DID NOT ATTEND  Cognitive:  DID NOT ATTEND  Insight:  None  Engagement in Group:  DID NOT ATTEND  Modes of Intervention:  DID NOT ATTEND  Summary of Progress/Problems: Pt did not attend patient self inventory group.   Juwan Vences O Mykaela Arena 01/19/2018, 3:22 PM 

## 2018-01-19 NOTE — Progress Notes (Addendum)
[facility] MD Progress Note  01/19/2018 8:37 AM Curtis Davenport  MRN:  [id number]  Evaluation: Curtis Davenport presents with a brighter affect during this assessment.  Awake, alert and oriented x3.  Seen resting in bed.  Denies suicidal or homicidal ideations during this assessment.  Reports taking medication as prescribed and tolerating them well.  Reports his back pain has improved some since medications.  Appears guarded and suspicious but pleasant.  Denies depression or depressive symptoms.  Reports a good appetite states he is resting well throughout the night.  9 support encouragement reassurance was provided.  History:Curtis Davenport is a 27 y/o M with history of schizophrenia who was admitted from [facility] on IVC after being brought in by his mother with worsening symptoms of psychosis including responding to internal stimuli, not caring for his personal hygiene, and episodes of agitation including at his most recent outpatient mental health follow up appointment. Pt was medically cleared in the ED and then transferred to [facility] for additional treatment and stabilization. He was started on trial of prolixin, and he dose has been titrated up during his stay. Pt has been reporting incremental improvement of his presenting symptoms.    Principal Problem: Schizophrenia, paranoid, chronic (HCC) Diagnosis:   Patient Active Problem List   Diagnosis Date Noted  . Schizophrenia, paranoid, chronic (HCC) [F20.0] 01/13/2018  . Psychosis (HCC) [F29]   . Paranoid schizophrenia (HCC) [F20.0] 01/10/2018  . Schizophrenia, undifferentiated (HCC) [F20.3]    Total Time spent with patient: 30 minutes  Past Psychiatric History: see H&P  Past Medical History:  Past Medical History:  Diagnosis Date  . Hallucinations   . PTSD (post-traumatic stress disorder)   . Schizophrenia (HCC)    History reviewed. No pertinent surgical history. Family History: History reviewed. No pertinent family history. Family Psychiatric  History: see  H&P Social History:  Social History   Substance and Sexual Activity  Alcohol Use Yes     Social History   Substance and Sexual Activity  Drug Use No    Social History   Socioeconomic History  . Marital status: Single    Spouse name: Not on file  . Number of children: Not on file  . Years of education: Not on file  . Highest education level: Not on file  Occupational History  . Not on file  Social Needs  . Financial resource strain: Not on file  . Food insecurity:    Worry: Not on file    Inability: Not on file  . Transportation needs:    Medical: Not on file    Non-medical: Not on file  Tobacco Use  . Smoking status: Current Some Day Smoker    Packs/day: 0.50  . Smokeless tobacco: Never Used  Substance and Sexual Activity  . Alcohol use: Yes  . Drug use: No  . Sexual activity: Yes  Lifestyle  . Physical activity:    Days per week: Not on file    Minutes per session: Not on file  . Stress: Not on file  Relationships  . Social connections:    Talks on phone: Not on file    Gets together: Not on file    Attends religious service: Not on file    Active member of club or organization: Not on file    Attends meetings of clubs or organizations: Not on file    Relationship status: Not on file  Other Topics Concern  . Not on file  Social History Narrative  . Not on file  Additional Social History:                         Sleep: Good  Appetite:  Good  Current Medications: Current Facility-Administered Medications  Medication Dose Route Frequency Provider Last Rate Last Dose  . acetaminophen (TYLENOL) tablet 650 mg  650 mg Oral Q6H PRN Laveda Abbe, NP   650 mg at 01/17/18 1859  . albuterol (PROVENTIL HFA;VENTOLIN HFA) 108 (90 Base) MCG/ACT inhaler 2 puff  2 puff Inhalation Q6H PRN Laveda Abbe, NP      . alum & mag hydroxide-simeth (MAALOX/MYLANTA) 200-200-20 MG/5ML suspension 30 mL  30 mL Oral Q4H PRN Laveda Abbe, NP       . benztropine (COGENTIN) tablet 0.5 mg  0.5 mg Oral BID Laveda Abbe, NP   0.5 mg at 01/19/18 0731  . fluPHENAZine (PROLIXIN) tablet 5 mg  5 mg Oral BID Micheal Likens, MD   5 mg at 01/19/18 0731  . hydrOXYzine (ATARAX/VISTARIL) tablet 50 mg  50 mg Oral Q6H PRN Micheal Likens, MD   50 mg at 01/14/18 2131  . ibuprofen (ADVIL,MOTRIN) tablet 600 mg  600 mg Oral Q6H PRN Micheal Likens, MD   600 mg at 01/17/18 1639  . lidocaine (LIDODERM) 5 % 1 patch  1 patch Transdermal Q24H Oneta Rack, NP   1 patch at 01/18/18 0907  . OLANZapine zydis (ZYPREXA) disintegrating tablet 10 mg  10 mg Oral Q8H PRN Micheal Likens, MD   10 mg at 01/15/18 1203   And  . LORazepam (ATIVAN) tablet 1 mg  1 mg Oral PRN Micheal Likens, MD       And  . ziprasidone (GEODON) injection 20 mg  20 mg Intramuscular PRN Micheal Likens, MD      . magnesium hydroxide (MILK OF MAGNESIA) suspension 30 mL  30 mL Oral Daily PRN Laveda Abbe, NP      . traZODone (DESYREL) tablet 100 mg  100 mg Oral QHS,MR X 1 Nira Conn A, NP   100 mg at 01/18/18 2105    Lab Results: No results found for this or any previous visit (from the past 48 hour(s)).  Blood Alcohol level:  Lab Results  Component Value Date   ETH <10 01/10/2018   ETH <5 08/31/2016    Metabolic Disorder Labs: Lab Results  Component Value Date   HGBA1C 5.6 01/15/2018   MPG 114.02 01/15/2018   Lab Results  Component Value Date   PROLACTIN 44.7 (H) 01/15/2018   Lab Results  Component Value Date   CHOL 213 (H) 01/15/2018   TRIG 221 (H) 01/15/2018   HDL 60 01/15/2018   CHOLHDL 3.6 01/15/2018   VLDL 44 (H) 01/15/2018   LDLCALC 109 (H) 01/15/2018    Physical Findings: AIMS:  , ,  ,  ,    CIWA:    COWS:     Musculoskeletal: Strength & Muscle Tone: within normal limits Gait & Station: normal Patient leans: N/A  Psychiatric Specialty Exam: Physical Exam  Nursing note and vitals  reviewed. Cardiovascular: Normal rate.  Psychiatric: He has a normal mood and affect.    Review of Systems  Psychiatric/Behavioral: Positive for hallucinations. Negative for depression and suicidal ideas. The patient is not nervous/anxious and does not have insomnia.   All other systems reviewed and are negative.   Blood pressure (!) 148/115, pulse 85, temperature 97.8 F (36.6 C), resp. rate  16, height 6' (1.829 m), weight 87.5 kg (193 lb), SpO2 98 %.Body mass index is 26.18 kg/m.  General Appearance: Casual and Fairly Groomed  Eye Contact:  Good  Speech:  Clear and Coherent and Normal Rate  Volume:  Normal  Mood:  Euthymic  Affect:  Appropriate, Congruent and Flat  Thought Process:  Coherent and Goal Directed  Orientation:  Full (Time, Place, and Person)  Thought Content:  Hallucinations: Auditory  Suicidal Thoughts:  No  Homicidal Thoughts:  No  Memory:  Immediate;   Fair Recent;   Fair Remote;   Fair  Judgement:  Poor  Insight:  Lacking  Psychomotor Activity:  Normal  Concentration:  Concentration: Fair  Recall:  [employer]Fair  Fund of Knowledge:  Fair  Language:  Fair  Akathisia:  No  Handed:    AIMS (if indicated):     Assets:  Resilience Social Support  ADL's:  Intact  Cognition:  WNL  Sleep:  Number of Hours: 6.75   Treatment Plan Summary: Daily contact with patient to assess and evaluate symptoms and progress in treatment and Medication management   Continue with current treatment plan as listed below on 01/19/2018 except were noted  -Schizophrenia - Continue fluphenazine (prolixin) 5 mg po bid.  -EPS.     - Continue Benztropine 0.5 mg po bid.  -Anxiety. - Continue Hydroxyzine 50 mg po prn Q 6 hours prn.  -Agitation/Psychosis. - Continue Olanzapine Zydis disintegrating tablet 10 mg Q 8 hrs prn  &Ativan 1 mg po as needed for 1 dose. &Geodon 20 mg IM prn for 1 dose.  -Insomnia. - Continue Trazodone 100 mg po Q hs prn, (may  repeat x 1).  Asthma.     - Continue Albuterol inhaler 108 (90 base) 2 puffs Q 6 hrs prn per inhalation.  Back pain:  Initiated Lidoderm patch 5% topical every 12 as needed    -Encourage patient to attend & participate in the group counseling sessions.  -The discharge disposition plan is ongoing.  Oneta Rackanika N Lewis, NP 01/19/2018, 8:37 AM   ..Agree with NP Progress Note

## 2018-01-20 NOTE — Progress Notes (Signed)
Recreation Therapy Notes  Date: 7.1.19 Time: 1000 Location: 500 Hall Dayroom  Group Topic: Coping Skills  Goal Area(s) Addresses:  Patients will be able to identify positive coping skills. Patients will identify benefits of using positive coping skills. Patients will identify benefits of using coping skills post d/c.  Intervention: Worksheet, pencils  Activity: Mind map.  LRT and patients filled out the first 8 boxes with anxiety, anger, depression, loneliness, finances, family, friends and betrayal.  Patients were to then identify 3 coping skills for each area identified before the group reconvened.   Education: Coping Skills, Discharge Planning.   Education Outcome: Acknowledges understanding/In group clarification offered/Needs additional education.   Clinical Observations/Feedback: Pt did not attend group.    Felton Buczynski, LRT/CTRS         Denene Alamillo A 01/20/2018 12:23 PM 

## 2018-01-20 NOTE — Progress Notes (Signed)
DAR NOTE: Pt present with flat affect and depressed mood in the unit. Pt has been isolating himself and has been bed most of the time. Pt observed laughing to inappropriately laughing to himself. Pt denies physical pain, took all his meds as scheduled. As per self inventory, pt had a poor night sleep, fair appetite, normal energy, and poor concentration. Pt rate depression at 5, hopeless ness at 5, and anxiety at 5. Pt's safety ensured with 15 minute and environmental checks. Pt currently denies SI/HI and A/V hallucinations. Pt verbally agrees to seek staff if SI/HI or A/VH occurs and to consult with staff before acting on these thoughts. Will continue POC.

## 2018-01-20 NOTE — Tx Team (Signed)
Interdisciplinary Treatment and Diagnostic Plan Update  01/20/2018 Time of Session: [id number] Curtis Davenport MRN: [id number]  Principal Diagnosis: Schizophrenia, paranoid, chronic (HCC)  Secondary Diagnoses: Principal Problem:   Schizophrenia, paranoid, chronic (HCC)   Current Medications:  Current Facility-Administered Medications  Medication Dose Route Frequency Provider Last Rate Last Dose  . acetaminophen (TYLENOL) tablet 650 mg  650 mg Oral Q6H PRN Laveda AbbeParks, Laurie Britton, NP   650 mg at 01/17/18 1859  . albuterol (PROVENTIL HFA;VENTOLIN HFA) 108 (90 Base) MCG/ACT inhaler 2 puff  2 puff Inhalation Q6H PRN Laveda AbbeParks, Laurie Britton, NP      . alum & mag hydroxide-simeth (MAALOX/MYLANTA) 200-200-20 MG/5ML suspension 30 mL  30 mL Oral Q4H PRN Laveda AbbeParks, Laurie Britton, NP      . benztropine (COGENTIN) tablet 0.5 mg  0.5 mg Oral BID Laveda AbbeParks, Laurie Britton, NP   0.5 mg at 01/20/18 0815  . fluPHENAZine (PROLIXIN) tablet 5 mg  5 mg Oral BID Micheal Likensainville, Christopher T, MD   5 mg at 01/20/18 0815  . hydrOXYzine (ATARAX/VISTARIL) tablet 50 mg  50 mg Oral Q6H PRN Micheal Likensainville, Christopher T, MD   50 mg at 01/14/18 2131  . ibuprofen (ADVIL,MOTRIN) tablet 600 mg  600 mg Oral Q6H PRN Micheal Likensainville, Christopher T, MD   600 mg at 01/17/18 1639  . lidocaine (LIDODERM) 5 % 1 patch  1 patch Transdermal Q24H Oneta RackLewis, Tanika N, NP   1 patch at 01/20/18 1131  . OLANZapine zydis (ZYPREXA) disintegrating tablet 10 mg  10 mg Oral Q8H PRN Micheal Likensainville, Christopher T, MD   10 mg at 01/15/18 1203   And  . LORazepam (ATIVAN) tablet 1 mg  1 mg Oral PRN Micheal Likens[place], Christopher T, MD       And  . ziprasidone (GEODON) injection 20 mg  20 mg Intramuscular PRN Micheal Likens[place], Christopher T, MD      . magnesium hydroxide (MILK OF MAGNESIA) suspension 30 mL  30 mL Oral Daily PRN Laveda AbbeParks, Laurie Britton, NP      . traZODone (DESYREL) tablet 100 mg  100 mg Oral QHS,MR X 1 Nira ConnBerry, Jason A, NP   100 mg at 01/19/18 2113   PTA Medications: Medications Prior to  Admission  Medication Sig Dispense Refill Last Dose  . albuterol (PROVENTIL HFA;VENTOLIN HFA) 108 (90 BASE) MCG/ACT inhaler Inhale 2 puffs into the lungs every 6 (six) hours as needed. For shortness of breath   > month  . ARIPiprazole (ABILIFY) 5 MG tablet Take 1 tablet (5 mg total) by mouth daily. 30 tablet 0 01/09/2018 at Unknown time    Patient Stressors: Financial difficulties Medication change or noncompliance  Patient Strengths: Average or above average intelligence  Treatment Modalities: Medication Management, Group therapy, Case management,  1 to 1 session with clinician, Psychoeducation, Recreational therapy.   Physician Treatment Plan for Primary Diagnosis: Schizophrenia, paranoid, chronic (HCC) Long Term Goal(s): Improvement in symptoms so as ready for discharge Improvement in symptoms so as ready for discharge   Short Term Goals: Ability to identify and develop effective coping behaviors will improve Ability to identify changes in lifestyle to reduce recurrence of condition will improve  Medication Management: Evaluate patient's response, side effects, and tolerance of medication regimen.  Therapeutic Interventions: 1 to 1 sessions, Unit Group sessions and Medication administration.  Evaluation of Outcomes: Progressing  Physician Treatment Plan for Secondary Diagnosis: Principal Problem:   Schizophrenia, paranoid, chronic (HCC)  Long Term Goal(s): Improvement in symptoms so as ready for discharge Improvement in symptoms so as ready for  discharge   Short Term Goals: Ability to identify and develop effective coping behaviors will improve Ability to identify changes in lifestyle to reduce recurrence of condition will improve     Medication Management: Evaluate patient's response, side effects, and tolerance of medication regimen.  Therapeutic Interventions: 1 to 1 sessions, Unit Group sessions and Medication administration.  Evaluation of Outcomes: Progressing   RN  Treatment Plan for Primary Diagnosis: Schizophrenia, paranoid, chronic (HCC) Long Term Goal(s): Knowledge of disease and therapeutic regimen to maintain health will improve  Short Term Goals: Ability to remain free from injury will improve, Ability to verbalize frustration and anger appropriately will improve, Ability to demonstrate self-control and Ability to identify and develop effective coping behaviors will improve  Medication Management: RN will administer medications as ordered by provider, will assess and evaluate patient's response and provide education to patient for prescribed medication. RN will report any adverse and/or side effects to prescribing provider.  Therapeutic Interventions: 1 on 1 counseling sessions, Psychoeducation, Medication administration, Evaluate responses to treatment, Monitor vital signs and CBGs as ordered, Perform/monitor CIWA, COWS, AIMS and Fall Risk screenings as ordered, Perform wound care treatments as ordered.  Evaluation of Outcomes: Progressing   LCSW Treatment Plan for Primary Diagnosis: Schizophrenia, paranoid, chronic (HCC) Long Term Goal(s): Safe transition to appropriate next level of care at discharge, Engage patient in therapeutic group addressing interpersonal concerns.  Short Term Goals: Engage patient in aftercare planning with referrals and resources, Increase ability to appropriately verbalize feelings, Increase emotional regulation and Increase skills for wellness and recovery  Therapeutic Interventions: Assess for all discharge needs, 1 to 1 time with Social worker, Explore available resources and support systems, Assess for adequacy in community support network, Educate family and significant other(s) on suicide prevention, Complete Psychosocial Assessment, Interpersonal group therapy.  Evaluation of Outcomes: Progressing   Progress in Treatment: Attending groups: No Participating in groups: No Taking medication as prescribed:  Yes. Toleration medication: Yes. Family/Significant other contact made: Yes, with mother. Patient understands diagnosis: No. poor insight; poor historian.  Discussing patient identified problems/goals with staff: Yes. Medical problems stabilized or resolved: Yes. Denies suicidal/homicidal ideation: Yes. Issues/concerns per patient self-inventory: No. Other: n/a   New problem(s) identified: No, Describe:  n/a  New Short Term/Long Term Goal(s): reduction in symptoms of psychosis/delusions and aggression, medication management for mood stabilization; elimination of SI thoughts; development of comprehensive mental wellness/sobriety plan.   Patient Goals:  "I'm not sure why I'm here. I need housing."   Discharge Plan or Barriers: CSW assessing. Pt has follow-up at [facility] for medication management. [facility] pamphlet, Mobile Crisis information, and AA/NA information provided to patient for additional community support and resources.   Reason for Continuation of Hospitalization: Aggression Delusions  Hallucinations Mania Medication stabilization  Estimated Length of Stay: 1-3 days.  Attendees: Patient: 01/20/2018   Physician: Dr Viviano Simas, MD 01/20/2018   Nursing: Roddie Mc, MD 01/20/2018   RN Care Manager: 01/20/2018   Social Worker: Daleen Squibb, LCSW 01/20/2018   Recreational Therapist:  01/20/2018   Other:  01/20/2018   Other:  01/20/2018   Other: 01/20/2018        Scribe for Treatment Team: Lorri Frederick, LCSW 01/20/2018 1:21 PM

## 2018-01-20 NOTE — Progress Notes (Signed)
Psychoeducational Group Note  Date:  01/20/2018 Time:  2358  Group Topic/Focus:  Wrap-Up Group:   The focus of this group is to help patients review their daily goal of treatment and discuss progress on daily workbooks.  Participation Level: Did Not Attend  Participation Quality:  Not Applicable  Affect:  Not Applicable  Cognitive:  Not Applicable  Insight:  Not Applicable  Engagement in Group: Not Applicable  Additional Comments:  The patient did not attend the evening group.   Arieonna Medine S 01/20/2018, 11:59 PM

## 2018-01-20 NOTE — Progress Notes (Signed)
[facility]  01/20/2018 3:31 PM Curtis Davenport  MRN:  [id number]  Evaluation: Curtis Davenport is seen in room resting in bed. He has not been attending groups.  He awakens easily and is pleasant once aroused, alert and oriented x3.  Denies suicidal or homicidal ideations during this assessment.  Reports taking medication as prescribed and tolerating them well.  Reports his back pain has improved some since medications, however describes some stiffness in arms and back.  He admits that he often forgets medication and has had an ACT team in the past that visited him when [facility] lived at the shelter.  He can not recall when he last worked with ACT.  He appears guarded and suspicious, especially as related to trial of injectable antipsychotic. Denies depression or depressive symptoms.  Reports a good appetite states he is resting well throughout the night. He needs encouragement and reinforcement for hygiene. Support encouragement reassurance was provided.  History:Curtis Davenport is a 27 y/o M with history of schizophrenia who was admitted from [facility] on IVC after being brought in by his mother with worsening symptoms of psychosis including responding to internal stimuli, not caring for his personal hygiene, and episodes of agitation including at his most recent outpatient mental health follow up appointment. Pt was medically cleared in the ED and [facility]. He was started on trial of prolixin, and he dose has been titrated up during his stay. Pt has been reporting incremental improvement of his presenting symptoms.  Principal Problem: Schizophrenia, paranoid, chronic (HCC) Diagnosis:   Patient Active Problem List   Diagnosis Date Noted  . Schizophrenia, paranoid, chronic (HCC) [F20.0] 01/13/2018  . Psychosis (HCC) [F29]   . Paranoid schizophrenia (HCC) [F20.0] 01/10/2018  . Schizophrenia, undifferentiated (HCC) [F20.3]    Total Time spent with patient: 30  minutes  Past Psychiatric History: see H&P  Past Medical History:  Past Medical History:  Diagnosis Date  . Hallucinations   . PTSD (post-traumatic stress disorder)   . Schizophrenia (HCC)    History reviewed. No pertinent surgical history. Family History: History reviewed. No pertinent family history. Family Psychiatric  History: see H&P Social History:  Social History   Substance and Sexual Activity  Alcohol Use Yes     Social History   Substance and Sexual Activity  Drug Use No    Social History   Socioeconomic History  . Marital status: Single    Spouse name: Not on file  . Number of children: Not on file  . Years of education: Not on file  . Highest education level: Not on file  Occupational History  . Not on file  Social Needs  . Financial resource strain: Not on file  . Food insecurity:    Worry: Not on file    Inability: Not on file  . Transportation needs:    Medical: Not on file    Non-medical: Not on file  Tobacco Use  . Smoking status: Current Some Day Smoker    Packs/day: 0.50  . Smokeless tobacco: Never Used  Substance and Sexual Activity  . Alcohol use: Yes  . Drug use: No  . Sexual activity: Yes  Lifestyle  . Physical activity:    Days per week: Not on file    Minutes per session: Not on file  . Stress: Not on file  Relationships  . Social connections:    Talks on phone: Not on file    Gets together: Not on  file    Attends religious service: Not on file    Active member of club or organization: Not on file    Attends meetings of clubs or organizations: Not on file    Relationship status: Not on file  Other Topics Concern  . Not on file  Social History Narrative  . Not on file   Additional Social History:  See H&P                        Sleep: Good  Appetite:  Good  Current Medications: Current Facility-Administered Medications  Medication Dose Route Frequency Provider Last Rate Last Dose  . acetaminophen (TYLENOL)  tablet 650 mg  650 mg Oral Q6H PRN Laveda Abbe, NP   650 mg at 01/17/18 1859  . albuterol (PROVENTIL HFA;VENTOLIN HFA) 108 (90 Base) MCG/ACT inhaler 2 puff  2 puff Inhalation Q6H PRN Laveda Abbe, NP      . alum & mag hydroxide-simeth (MAALOX/MYLANTA) 200-200-20 MG/5ML suspension 30 mL  30 mL Oral Q4H PRN Laveda Abbe, NP      . benztropine (COGENTIN) tablet 0.5 mg  0.5 mg Oral BID Laveda Abbe, NP   0.5 mg at 01/20/18 0815  . fluPHENAZine (PROLIXIN) tablet 5 mg  5 mg Oral BID Micheal Likens, MD   5 mg at 01/20/18 0815  . hydrOXYzine (ATARAX/VISTARIL) tablet 50 mg  50 mg Oral Q6H PRN Micheal Likens, MD   50 mg at 01/14/18 2131  . ibuprofen (ADVIL,MOTRIN) tablet 600 mg  600 mg Oral Q6H PRN Micheal Likens, MD   600 mg at 01/17/18 1639  . lidocaine (LIDODERM) 5 % 1 patch  1 patch Transdermal Q24H Oneta Rack, NP   1 patch at 01/20/18 1131  . OLANZapine zydis (ZYPREXA) disintegrating tablet 10 mg  10 mg Oral Q8H PRN Micheal Likens, MD   10 mg at 01/15/18 1203   And  . LORazepam (ATIVAN) tablet 1 mg  1 mg Oral PRN Micheal Likens, MD       And  . ziprasidone (GEODON) injection 20 mg  20 mg Intramuscular PRN Micheal Likens, MD      . magnesium hydroxide (MILK OF MAGNESIA) suspension 30 mL  30 mL Oral Daily PRN Laveda Abbe, NP      . traZODone (DESYREL) tablet 100 mg  100 mg Oral QHS,MR X 1 Nira Conn A, NP   100 mg at 01/19/18 2113    Lab Results: No results found for this or any previous visit (from the past 48 hour(s)).  Blood Alcohol level:  Lab Results  Component Value Date   ETH <10 01/10/2018   ETH <5 08/31/2016    Metabolic Disorder Labs: Lab Results  Component Value Date   HGBA1C 5.6 01/15/2018   MPG 114.02 01/15/2018   Lab Results  Component Value Date   PROLACTIN 44.7 (H) 01/15/2018   Lab Results  Component Value Date   CHOL 213 (H) 01/15/2018   TRIG 221 (H)  01/15/2018   HDL 60 01/15/2018   CHOLHDL 3.6 01/15/2018   VLDL 44 (H) 01/15/2018   LDLCALC 109 (H) 01/15/2018    Physical Findings: AIMS: Facial and Oral Movements Muscles of Facial Expression: None, normal Lips and Perioral Area: None, normal Jaw: None, normal Tongue: None, normal,Extremity Movements Upper (arms, wrists, hands, fingers): None, normal Lower (legs, knees, ankles, toes): None, normal, Trunk Movements Neck, shoulders, hips: None, normal, Overall  Severity Severity of abnormal movements (highest score from questions above): None, normal Incapacitation due to abnormal movements: None, normal Patient's awareness of abnormal movements (rate only patient's report): No Awareness, Dental Status Current problems with teeth and/or dentures?: No Does patient usually wear dentures?: No  CIWA:    COWS:     Musculoskeletal: Strength & Muscle Tone: within normal limits Gait & Station: normal Patient leans: N/A  Psychiatric Specialty Exam: Physical Exam  Nursing note and vitals reviewed. Constitutional: He is oriented to person, place, and time. He appears well-developed. No distress.  HENT:  Head: Normocephalic.  Musculoskeletal: Normal range of motion.  Neurological: He is oriented to person, place, and time.  Psychiatric:  Paranoid    Review of Systems  Psychiatric/Behavioral: Positive for hallucinations. Negative for depression and suicidal ideas. The patient is not nervous/anxious and does not have insomnia.   All other systems reviewed and are negative.   Blood pressure (!) 149/108, pulse 68, temperature 97.8 F (36.6 C), temperature source Oral, resp. rate 16, height 6' (1.829 m), weight 87.5 kg (193 lb), SpO2 98 %.Body mass index is 26.18 kg/m.  General Appearance: Casual and Fairly Groomed  Eye Contact:  Good  Speech:  Clear and Coherent and Normal Rate  Volume:  Normal  Mood:  Euthymic  Affect:  Appropriate, Congruent and Flat  Thought Process:  Coherent  and Goal Directed  Orientation:  Full (Time, Place, and Person)  Thought Content:  Hallucinations: Auditory  Suicidal Thoughts:  No  Homicidal Thoughts:  No  Memory:  Immediate;   Fair Recent;   Fair Remote;   Fair  Judgement:  Poor  Insight:  Lacking  Psychomotor Activity:  Normal  Concentration:  Concentration: Fair  Recall:  [employer] of Knowledge:  Fair  Language:  Fair  Akathisia:  No  Handed:    AIMS (if indicated):     Assets:  Resilience Social Support  ADL's:  Intact  Cognition:  WNL  Sleep:  Number of Hours: 5.75   Treatment Plan Summary: Daily contact with patient to assess and evaluate symptoms and progress in treatment and Medication management   Continue with current treatment plan as listed below on 01/19/2018 except were noted  -Schizophrenia - Continue fluphenazine (prolixin) 5 mg po bid.  -EPS.     - Continue Benztropine 0.5 mg po bid.  -Anxiety. - Continue Hydroxyzine 50 mg po prn Q 6 hours prn.  -Agitation/Psychosis. - Continue Olanzapine Zydis disintegrating tablet 10 mg Q 8 hrs prn  &Ativan 1 mg po as needed for 1 dose. &Geodon 20 mg IM prn for 1 dose.  -Insomnia. - Continue Trazodone 100 mg po Q hs prn, (may repeat x 1).  Asthma.     - Continue Albuterol inhaler 108 (90 base) 2 puffs Q 6 hrs prn per inhalation.  Back pain:      - Continue Lidoderm patch 5% topical every 12 as needed    -Encourage patient to attend & participate in the group counseling sessions.  -The discharge disposition plan is ongoing.  Mariel Craft, MD 01/20/2018, 3:31 PM

## 2018-01-20 NOTE — [facility] Suicide Risk Assessment (Signed)
[facility] INPATIENT:  Family/Significant Other Suicide Prevention Education  Suicide Prevention Education:  Education Completed;Curtis Davenport, mother andpayee, [phone],  has been identified by the patient as the family member/significant other with whom the patient will be residing, and identified as the person(s) who will aid the patient in the event of a mental health crisis (suicidal ideations/suicide attempt).  With written consent from the patient, the family member/significant other has been provided the following suicide prevention education, prior to the and/or following the discharge of the patient.  The suicide prevention education provided includes the following:  Suicide risk factors  Suicide prevention and interventions  National Suicide Hotline telephone number  [facility] assessment telephone number  [facility] Emergency Assistance 911  [facility] and/or Residential Mobile Crisis Unit telephone number  Request made of family/significant other to:  Remove weapons (e.g., guns, rifles, knives), all items previously/currently identified as safety concern.  No guns in the home, per mother.  Remove drugs/medications (over-the-counter, prescriptions, illicit drugs), all items previously/currently identified as a safety concern.  The family member/significant other verbalizes understanding of the suicide prevention education information provided.  The family member/significant other agrees to remove the items of safety concern listed above.  Pt was in prison from 2010-2012.  He was in a psychiatric unit in prison but when he was released, he was not going well with mental health and has done poorly ever since.  Lots of marijuana prior to all of this.  Pt has never had an act team and mother would be interested in this.  Pt's brother was killed in 04/2017 and pt has definitely worse since then.  Curtis Davenport, Curtis Hantz Jon, LCSW 01/20/2018, 9:41 AM

## 2018-01-20 NOTE — Progress Notes (Signed)
Nursing Progress Note: 7p-7a D: Pt currently presents with a anxious/assertive/childlike/animated affect and behavior. Pt states "I am here because apparently I was hurting myself with glass. I don't know. I know I feel fine now. No more suicidal thoughts." Interacting appropriately with the milieu. Pt reports good sleep during the previous night with current medication regimen. Pt did not attend wrap-up group.  A: Pt provided with medications per providers orders. Pt's labs and vitals were monitored throughout the night. Pt supported emotionally and encouraged to express concerns and questions. Pt educated on medications.  R: Pt's safety ensured with 15 minute and environmental checks. Pt currently denies SI, HI, and VH and endorses intermittent AH. Pt verbally contracts to seek staff if SI,HI, or AVH occurs and to consult with staff before acting on any harmful thoughts. Will continue to monitor.

## 2018-01-21 MED ORDER — LIDOCAINE 5 % EX PTCH: 1.0000 | MEDICATED_PATCH | CUTANEOUS | 0 refills | Status: AC

## 2018-01-21 MED ORDER — FLUPHENAZINE HCL 5 MG PO TABS: 5.0000 mg | ORAL_TABLET | Freq: Two times a day (BID) | ORAL | 0 refills | Status: DC

## 2018-01-21 MED ORDER — BENZTROPINE MESYLATE 0.5 MG PO TABS: 0.5000 mg | ORAL_TABLET | Freq: Two times a day (BID) | ORAL | 0 refills | Status: DC

## 2018-01-21 MED ORDER — ALBUTEROL SULFATE HFA 108 (90 BASE) MCG/ACT IN AERS: 2.0000 | INHALATION_SPRAY | Freq: Four times a day (QID) | RESPIRATORY_TRACT | Status: DC | PRN

## 2018-01-21 MED ORDER — HYDROXYZINE HCL 50 MG PO TABS: 50.0000 mg | ORAL_TABLET | Freq: Four times a day (QID) | ORAL | 0 refills | Status: DC | PRN

## 2018-01-21 MED ORDER — TRAZODONE HCL 100 MG PO TABS: 100.0000 mg | ORAL_TABLET | Freq: Every evening | ORAL | 0 refills | Status: AC | PRN

## 2018-01-21 NOTE — [facility] Group Notes (Signed)
[facility] 01/21/2018 1:15pm  Type of Therapy: Mental Health Association Presentation  Participation Level: Did not attend  Participation Quality:   Affect:   Cognitive:   Insight:   Engagement in Therapy:   Modes of Intervention: Discussion, Education and Socialization  Summary of Progress/Problems: Mental Health Association (MHA) Speaker came to talk about his personal journey with mental health. The pt processed ways by which to relate to the speaker. MHA speaker provided handouts and educational information pertaining to groups and services offered by the [facility]. Pt was engaged in speaker's presentation and was receptive to resources provided.    [name], Wojciech Willetts Jon, LCSW 01/21/2018 1:25 PM

## 2018-01-21 NOTE — Progress Notes (Signed)
CSW faxed info to [facility] ACT team.  Received call back from chelsea at [facility].  They did receive referral and will begin working on it and contact pt to set up an intake appt. Garner NashGregory Amiya Escamilla, MSW, LCSW Clinical Social Worker 01/21/2018 10:28 AM

## 2018-01-21 NOTE — Progress Notes (Signed)
Nursing Progress Note: 7p-7a D: Pt currently presents with a anxious/pleasant affect and behavior. Interacting appropriately with the milieu. Pt reports good sleep during the previous night with current medication regimen.   A: Pt provided with medications per providers orders. Pt's labs and vitals were monitored throughout the night. Pt supported emotionally and encouraged to express concerns and questions. Pt educated on medications.  R: Pt's safety ensured with 15 minute and environmental checks. Pt currently denies SI, HI, and AVH. Pt verbally contracts to seek staff if SI,HI, or AVH occurs and to consult with staff before acting on any harmful thoughts. Will continue to monitor.  

## 2018-01-21 NOTE — Progress Notes (Signed)
Recreation Therapy Notes  INPATIENT RECREATION TR PLAN  Patient Details Name: Curtis Davenport MRN: [id number] DOB: 1990-12-04 Today's Date: 01/21/2018  Rec Therapy Plan Is patient appropriate for Therapeutic Recreation?: Yes Treatment times per week: about 3 days Estimated Length of Stay: 5-7 days TR Treatment/Interventions: Group participation (Comment)  Discharge Criteria Pt will be discharged from therapy if:: Discharged Treatment plan/goals/alternatives discussed and agreed upon by:: Patient/family  Discharge Summary Short term goals set: See patient care plan Short term goals met: Not met Reason goals not met: Pt did not attend groups. Therapeutic equipment acquired: N/A Reason patient discharged from therapy: Discharge from hospital Pt/family agrees with progress & goals achieved: Yes Date patient discharged from therapy: 01/21/18    Victorino Sparrow, LRT/CTRS   Ria Comment, Cambria Osten A 01/21/2018, 11:48 AM

## 2018-01-21 NOTE — Progress Notes (Signed)
Recreation Therapy Notes  Date: 7.2.19 Time: 0945 Location: [address] Dayroom  Group Topic: Wellness  Goal Area(s) Addresses:  Patient will define components of whole wellness. Patient will verbalize benefit of whole wellness.  Intervention: Music, Exercise  Activity:  Exercise.  LRT lead group in a series of stretches to get them loose.  Once patients were loose, each patient got the opportunity to lead the group in an exercise.  The group went through 4 rounds of exercises with a water break in between.    Education: Wellness, Building control surveyorDischarge Planning.   Education Outcome: Acknowledges education/In group clarification offered/Needs additional education.   Clinical Observations/Feedback: Pt did not attend group.    Caroll RancherMarjette Chucky Homes, LRT/CTRS     Lillia AbedLindsay, Zachary Nole A 01/21/2018 11:27 AM

## 2018-01-21 NOTE — Discharge Summary (Addendum)
Physician Discharge Summary Note   Patient:  Curtis Davenport is an 27 y.o., male  MRN:  [id number]  DOB:  11-12-1990  Patient phone:  [phone] (home)   Patient address:   [place] [address],   Total Time spent with patient: Greater than 30 minutes  Date of Admission:  01/13/2018  Date of Discharge: 01-21-18  Reason for Admission: Worsening symptoms of psychosis including responding to internal stimuli, not caring for his personal hygiene and episodes of agitation.  Principal Problem: Schizophrenia, paranoid, chronic [facility])  Discharge Diagnoses: Patient Active Problem List   Diagnosis Date Noted  . Schizophrenia, paranoid, chronic ([place]) [F20.0] 01/13/2018  . Psychosis ([place]) [F29]   . Paranoid schizophrenia ([place]) [F20.0] 01/10/2018  . 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  Past Medical History:  Past Medical History:  Diagnosis Date  . Hallucinations   . PTSD (post-traumatic stress disorder)   . Schizophrenia ([place])    History reviewed. No pertinent surgical history.  Family History: History reviewed. No pertinent family history.  Family Psychiatric  History: See H&P.  Social History:  Social History   Substance and Sexual Activity  Alcohol Use Yes     Social History   Substance and Sexual Activity  Drug Use No    Social History   Socioeconomic History  . Marital status: Single    Spouse name: Not on file  . Number of children: Not on file  . Years of education: Not on file  . Highest education level: Not on file  Occupational History  . Not on file  Social Needs  . Financial resource strain: Not on file  . Food insecurity:    Worry: Not on file    Inability: Not on file  . Transportation needs:    Medical: Not on file    Non-medical: Not on file  Tobacco Use  . Smoking status: Current Some Day Smoker    Packs/day: 0.50  . Smokeless tobacco: Never Used  Substance and  Sexual Activity  . Alcohol use: Yes  . Drug use: No  . Sexual activity: Yes  Lifestyle  . Physical activity:    Days per week: Not on file    Minutes per session: Not on file  . Stress: Not on file  Relationships  . Social connections:    Talks on phone: Not on file    Gets together: Not on file    Attends religious service: Not on file    Active member of club or organization: Not on file    Attends meetings of clubs or organizations: Not on file    Relationship status: Not on file  Other Topics Concern  . Not on file  Social History Narrative  . Not on file   Hospital Course: (Per Md's SRA): Curtis Davenport is a 27 y/o M with history of schizophrenia who was admitted from [facility] on IVC after being brought in by his mother with worsening symptoms of psychosis including responding to internal stimuli, not caring for his personal hygiene, and episodes of agitation including at his most recent outpatient mental health follow up appointment. Pt was medically cleared in the ED and then transferred to [facility] for additional treatment and stabilization. Upon initial interview, pt is vague, circumstantial, and minimizing. He is generally cooperative with the interview, but he is overall a poor historian. When asked why he was brought to the hospital, he shares, "A tough situation, trying to correct  with my family. Members of my family basically brought me here. I feel like the situation, that's basically come about to help me."  Curtis Davenport was admitted to the [facility] for worsening symptoms of Paranoid Schizophrenia. Admission reports indicated that patient was having psychosis & has not been attending to his personal hygiene. He was in need of mood stabilization.    After his admission assessment/evaluation, it was determined based on his presenting symptoms that Curtis Davenport will benefit from medication management to re-stabilize his psychotic symptoms. And with his consent, he was started on the medication regimen that  purposefully targeted his presenting symptoms. He was medicated & discharged on; Cogentin 0.5 mg for EPS, Prolixin 5 mg for mood control, Vistaril 50 mg prn for anxiety & Trazodone 100 mg prn for insomnia. He was enrolled & participated in the group counseling sessions being offered and held on this unit. He learned coping skills that should help him after discharge to cope better & maintain mood stability.  Curtis Davenport symptoms responded well to his treatment regimen. This is evidenced by his daily reports of improved mood, absence of suicidal ideations, homicidal ideations & or hallucinations. He is currently being discharged to continue psychiatric mental health treatment on an outpatient basis as noted below. He is provided with all the pertinent information required to make this appointment without problems. Upon discharge, Curtis Davenport adamantly denies any SIHI, AVH, delusional thoughts or paranoia. He left [facility] with all personal belongings in no apparent distress.  Physical Findings: AIMS: Facial and Oral Movements Muscles of Facial Expression: None, normal Lips and Perioral Area: None, normal Jaw: None, normal Tongue: None, normal,Extremity Movements Upper (arms, wrists, hands, fingers): None, normal Lower (legs, knees, ankles, toes): None, normal, Trunk Movements Neck, shoulders, hips: None, normal, Overall Severity Severity of abnormal movements (highest score from questions above): None, normal Incapacitation due to abnormal movements: None, normal Patient's awareness of abnormal movements (rate only patient's report): No Awareness, Dental Status Current problems with teeth and/or dentures?: No Does patient usually wear dentures?: No  CIWA:    COWS:     Musculoskeletal: Strength & Muscle Tone: within normal limits Gait & Station: normal Patient leans: N/A  Psychiatric Specialty Exam: Physical Exam  Constitutional: He appears well-developed.  HENT:  Head: Normocephalic.  Eyes: Pupils are  equal, round, and reactive to light.  Neck: Normal range of motion.  Cardiovascular: Normal rate.  Respiratory: Effort normal.  GI: Soft.  Genitourinary:  Genitourinary Comments: Deferred  Musculoskeletal: Normal range of motion.  Neurological: He is alert.  Skin: Skin is warm.    Review of Systems  Constitutional: Negative.   HENT: Negative.   Eyes: Negative.   Respiratory: Negative.   Cardiovascular: Negative.  Negative for chest pain and palpitations.  Gastrointestinal: Negative.   Genitourinary: Negative.   Musculoskeletal: Negative.   Skin: Negative.   Neurological: Negative.   Endo/Heme/Allergies: Negative.   Psychiatric/Behavioral: Positive for depression (Stabilized with medication prior to discharge) and hallucinations ( Hx. psychosis, stabilized with medication prior to discharge). Negative for memory loss, substance abuse and suicidal ideas. The patient has insomnia (Stabilized with medication prior to discharge). The patient is not nervous/anxious.     Blood pressure 121/67, pulse 83, temperature 97.9 F (36.6 C), temperature source Oral, resp. rate 18, height 6' (1.829 m), weight 87.5 kg (193 lb), SpO2 98 %.Body mass index is 26.18 kg/m.  See Md's SRA   Have you used any form of tobacco in the last 30 days? (Cigarettes, Smokeless Tobacco, Cigars, and/or  Pipes): Yes  Has this patient used any form of tobacco in the last 30 days? (Cigarettes, Smokeless Tobacco, Cigars, and/or Pipes):,N/A  Blood Alcohol level:  Lab Results  Component Value Date   ETH <10 01/10/2018   ETH <5 50/09/7046   Metabolic Disorder Labs:  Lab Results  Component Value Date   HGBA1C 5.6 01/15/2018   MPG 114.02 01/15/2018   Lab Results  Component Value Date   PROLACTIN 44.7 (H) 01/15/2018   Lab Results  Component Value Date   CHOL 213 (H) 01/15/2018   TRIG 221 (H) 01/15/2018   HDL 60 01/15/2018   CHOLHDL 3.6 01/15/2018   VLDL 44 (H) 01/15/2018   LDLCALC 109 (H) 01/15/2018   See  Psychiatric Specialty Exam and Suicide Risk Assessment completed by Attending Physician prior to discharge.  Discharge destination:  Home  Is patient on multiple antipsychotic therapies at discharge:  No   Has Patient had three or more failed trials of antipsychotic monotherapy by history:  No  Recommended Plan for Multiple Antipsychotic Therapies: NA  Allergies as of 01/21/2018      Reactions   Shellfish Allergy Hives, Swelling      Medication List    STOP taking these medications   ARIPiprazole 5 MG tablet Commonly known as:  ABILIFY     TAKE these medications     Indication  albuterol 108 (90 Base) MCG/ACT inhaler Commonly known as:  PROVENTIL HFA;VENTOLIN HFA Inhale 2 puffs into the lungs every 6 (six) hours as needed for shortness of breath. What changed:    reasons to take this  additional instructions  Indication:  Asthma   benztropine 0.5 MG tablet Commonly known as:  COGENTIN Take 1 tablet (0.5 mg total) by mouth 2 (two) times daily. For prevention of drug induced tremors  Indication:  Extrapyramidal Reaction caused by Medications   fluPHENAZine 5 MG tablet Commonly known as:  PROLIXIN Take 1 tablet (5 mg total) by mouth 2 (two) times daily. For mood control  Indication:  Mood control   hydrOXYzine 50 MG tablet Commonly known as:  ATARAX/VISTARIL Take 1 tablet (50 mg total) by mouth every 6 (six) hours as needed for anxiety.  Indication:  Feeling Anxious   lidocaine 5 % Commonly known as:  LIDODERM Place 1 patch onto the skin daily. Remove & Discard patch within 12 hours or as directed by MD: For pain  Indication:  Pain   traZODone 100 MG tablet Commonly known as:  DESYREL Take 1 tablet (100 mg total) by mouth at bedtime as needed for sleep.  Indication:  [facility] Follow up on 02/05/2018.   Why:  2:30PM with Crystal.  Contact information: [address] [place]  [address] [phone]        [facility] Follow up.   Contact information: [place] [address] [phone]          Follow-up recommendations: Activity:  As tolerated Diet: As recommended by your primary care doctor. Keep all scheduled follow-up appointments as recommended.   Comments: Patient is instructed prior to discharge to: Take all medications as prescribed by his/her mental healthcare provider. Report any adverse effects and or reactions from the medicines to his/her outpatient provider promptly. Patient has been instructed & cautioned: To not engage in alcohol and or illegal drug use while on prescription medicines. In the event of worsening symptoms, patient  is instructed to call the crisis hotline, 911 and or go to the nearest ED for appropriate evaluation and treatment of symptoms. To follow-up with his/her primary care provider for your other medical issues, concerns and or health care needs.   Signed: Lindell Spar, NP, PMHNP, FNP-BC 01/21/2018, 10:07 AM   I have reviewed NP's Note, assessement, diagnosis and plan, and agree. I have also met with patient and completed suicide risk assessment.  Lavella Hammock, MD

## 2018-01-21 NOTE — BHH Suicide Risk Assessment (Signed)
[facility] Discharge Suicide Risk Assessment   Principal Problem: Schizophrenia, paranoid, chronic (HCC) Discharge Diagnoses:  Patient Active Problem List   Diagnosis Date Noted  . Schizophrenia, paranoid, chronic (HCC) [F20.0] 01/13/2018  . Psychosis (HCC) [F29]   . Paranoid schizophrenia (HCC) [F20.0] 01/10/2018  . Schizophrenia, undifferentiated (HCC) [F20.3]     Total Time spent with patient: 30 minutes   Evaluation: Curtis Davenport is seen in room resting in bed. He has not been attending groups.  He awakens easily and is pleasant once aroused, alert and oriented x3.  Denies suicidal or homicidal ideations during this assessment.  Reports taking medication as prescribed and tolerating them well.  He states "I feel good", and reports that he is ready for discharge. He plans to have an ACT team in place to "remind him to take his medicine." Denies depression or depressive symptoms.  Reports a good appetite states he is resting well throughout the night. Support encouragement reassurance was provided.  History:Curtis Davenport is a 27 y/o M with history of schizophrenia who was admitted from [facility] on IVC after being brought in by his mother with worsening symptoms of psychosis including responding to internal stimuli, not caring for his personal hygiene, and episodes of agitation including at his most recent outpatient mental health follow up appointment. Pt was medically cleared in the ED and then transferred to [facility] for additional treatment and stabilization. He was started on trial of prolixin, and he dose has been titrated up during his stay. Pt has been reporting incremental improvement of his presenting symptoms. At time of discharge he is able to state benefit of working with an ACT team.  He denies SI, HI, AVH.     Musculoskeletal: Strength & Muscle Tone: within normal limits Gait & Station: normal Patient leans: N/A  Psychiatric Specialty Exam: Review of Systems  Constitutional: Negative.    Psychiatric/Behavioral: Negative for depression, hallucinations and suicidal ideas. The patient is not nervous/anxious and does not have insomnia.     Blood pressure 121/67, pulse 83, temperature 97.9 F (36.6 C), temperature source Oral, resp. rate 18, height 6' (1.829 m), weight 87.5 kg (193 lb), SpO2 98 %.Body mass index is 26.18 kg/m.  General Appearance: Casual and Fairly Groomed  Patent attorney::  Fair  Speech:  Normal Rate409  Volume:  Normal  Mood:  Euthymic  Affect:  Constricted  Thought Process:  Coherent  Orientation:  Full (Time, Place, and Person)  Thought Content:  Logical and Hallucinations: None  Suicidal Thoughts:  No  Homicidal Thoughts:  No  Memory:  Recent;   Fair Remote;   Fair  Judgement:  Fair  Insight:  Lacking  Psychomotor Activity:  Normal  Concentration:  Fair  Recall:  [employer] of Knowledge:Fair  Language: Fair  Akathisia:  No  Handed:    AIMS (if indicated):   0  Assets:  Desire for Improvement Housing Social Support  Sleep:  Number of Hours: 6.75  Cognition: Impaired,  Mild  ADL's:  Intact   Mental Status Per Nursing Assessment::   On Admission:  NA  Demographic Factors:  Male, Adolescent or young adult, Low socioeconomic status and Unemployed  Loss Factors: NA  Historical Factors: NA  Risk Reduction Factors:   Living with another person, especially a relative and Positive therapeutic relationship  Continued Clinical Symptoms:  Schizophrenia:   Paranoid or undifferentiated type  Cognitive Features That Contribute To Risk:  Loss of executive function    Suicide Risk:  Minimal: No identifiable suicidal  ideation.  Patients presenting with no risk factors but with morbid ruminations; may be classified as minimal risk based on the severity of the depressive symptoms  Follow-up Information    [facility] on 02/05/2018.   Why:  2:30PM with Crystal.  Contact information: [address] [place] [id number] [phone]        [facility] Call in 3 day(s).   Why:  [facility] will contact you with an intake appt date.  Please call them within three days of discharge if you have not received a call from them.  Tell them you were [facility] for [facility] on 01/21[facility]. Contact information: [address] [place] [address] [phone]           Plan Of Care/Follow-up recommendations:  Activity:  as tolerated Diet:  as tolerated   Treatment Plan Summary:  Continue with current treatment plan:  -Schizophrenia - Continue fluphenazine (prolixin) 5 mg po bid.    -EPS. - Continue Benztropine 0.5 mg po bid.  -Anxiety. - Continue Hydroxyzine 50 mg po prn Q 6 hours prn.  -Insomnia. - Continue Trazodone 100 mg po Q hs prn, (may repeat x 1).  Asthma. - Continue Albuterol inhaler 108 (90 base) 2 puffs Q 6 hrs prn per inhalation.  Back pain:      - Continue Lidoderm patch 5% topical every 12 as needed              -The discharge disposition: 01/21/2018 into care of mother.    Mariel CraftSHEILA M MAURER, MD 01/21/2018, 1:49 PM

## 2018-01-21 NOTE — Progress Notes (Signed)
Patient denies SI, HI and AVH this shift.  Patient engaged in groups, unit activities and compliant with medications.  Patient had no incidents of behavioral dyscontrol.   Assess patient for safety, offer medications as prescribed, engage patient in 1:1 staff talks.   Continue to monitor as prescribed.  Patient able to contract for safety.    

## 2018-01-21 NOTE — Progress Notes (Signed)
CSW called pt mother Odella Aquasonya Ahnor in the AM and the afternoon attempting to arrange ride for pt.  CSW also left message on 01/20/18 in the afternoon.  No return call. Garner NashGregory Natilee Gauer, MSW, LCSW Clinical Social Worker 01/21/2018 3:20 PM

## 2018-01-21 NOTE — Plan of Care (Signed)
Pt did not attend recreation therapy group sessions.   Mikael Debell, LRT/CTRS 

## 2018-01-22 NOTE — Progress Notes (Signed)
Recreation Therapy Notes  Date: 7.3.19 Time: 1000 Location: 500 Hall Dayroom  Group Topic: Communication  Goal Area(s) Addresses:  Patient will effectively communicate with peers in group.  Patient will verbalize benefit of healthy communication. Patient will verbalize positive effect of healthy communication on post d/c goals.  Patient will identify communication techniques that made activity effective for group.   Intervention: Drawings  Activity:  Merchandiser, retailGeometrical Drawings.  Patients were paired up and sitting back to back.  Each patient was given the opportunity to listen and be the speaker.  The person speaking had to describe the picture as it appears to their partner.  The only question the listener could ask was for the speaker to repeat themselves.  Education: Communication, Discharge Planning  Education Outcome: Acknowledges understanding/In group clarification offered/Needs additional education.   Clinical Observations/Feedback:  Pt did not attend group.    Caroll RancherMarjette Santez [place], LRT/CTRS       Caroll RancherLindsay, Rex Oesterle A 01/22/2018 11:54 AM

## 2018-01-22 NOTE — BHH Suicide Risk Assessment (Signed)
[facility] Discharge Suicide Risk Assessment   Principal Problem: Schizophrenia, paranoid, chronic (HCC) Discharge Diagnoses:  Patient Active Problem List   Diagnosis Date Noted  . Schizophrenia, paranoid, chronic (HCC) [F20.0] 01/13/2018  . Psychosis (HCC) [F29]   . Paranoid schizophrenia (HCC) [F20.0] 01/10/2018  . Schizophrenia, undifferentiated (HCC) [F20.3]     Total Time spent with patient: 30 minutes   Evaluation: Curtis Davenport is seen in room resting in bed. He has not been attending groups.  He awakens easily and is pleasant once aroused, alert and oriented x3.  Denies suicidal or homicidal ideations during this assessment.  Reports taking medication as prescribed and tolerating them well.  He states "I feel good", and reports that he is ready for discharge. He plans to have an ACT team in place to "remind him to take his medicine." Again discussed transition to long acting injectable antipsychotic.  Curtis Davenport agrees to discuss with ACT team. Denies depression or depressive symptoms.  Reports a good appetite states he is resting well throughout the night. Support encouragement reassurance was provided.  History:Curtis Davenport is a 27 y/o M with history of schizophrenia who was admitted from [facility] on [facility] after being brought in by his mother with worsening symptoms of psychosis including responding to internal stimuli, not caring for his personal hygiene, and episodes of agitation including at his most recent outpatient mental health follow up appointment. Pt was medically cleared in the ED and then transferred to [facility] for additional treatment and stabilization. He was started on trial of prolixin, and he dose has been titrated up during his stay. Pt has been reporting incremental improvement of his presenting symptoms. At time of discharge he is able to state benefit of working with an ACT team.  He denies SI, HI, AVH.     Musculoskeletal: Strength & Muscle Tone: within normal limits Gait & Station:  normal Patient leans: N/A  Psychiatric Specialty Exam: Review of Systems  Constitutional: Negative.   Psychiatric/Behavioral: Negative for depression, hallucinations and suicidal ideas. The patient is not nervous/anxious and does not have insomnia.     Blood pressure 122/82, pulse 91, temperature 97.9 F (36.6 C), temperature source Oral, resp. rate 18, height 6' (1.829 m), weight 87.5 kg (193 lb), SpO2 98 %.Body mass index is 26.18 kg/m.  General Appearance: Casual and Fairly Groomed  Patent attorney::  Fair  Speech:  Normal Rate409  Volume:  Normal  Mood:  Euthymic  Affect:  Constricted  Thought Process:  Coherent  Orientation:  Full (Time, Place, and Person)  Thought Content:  Logical and Hallucinations: None  Suicidal Thoughts:  No  Homicidal Thoughts:  No  Memory:  Recent;   Fair Remote;   Fair  Judgement:  Fair  Insight:  Lacking  Psychomotor Activity:  Normal  Concentration:  Fair  Recall:  [employer] of Knowledge:Fair  Language: Fair  Akathisia:  No  Handed:    AIMS (if indicated):   0  Assets:  Desire for Improvement Housing Social Support  Sleep:  Number of Hours: 6.5  Cognition: Impaired,  Mild  ADL's:  Intact   Mental Status Per Nursing Assessment::   On Admission:  NA  Demographic Factors:  Male, Adolescent or young adult, Low socioeconomic status and Unemployed  Loss Factors: NA  Historical Factors: NA  Risk Reduction Factors:   Living with another person, especially a relative and Positive therapeutic relationship  Continued Clinical Symptoms:  Schizophrenia:   Paranoid or undifferentiated type  Cognitive Features That Contribute To  Risk:  Loss of executive function    Suicide Risk:  Minimal: No identifiable suicidal ideation.  Patients presenting with no risk factors but with morbid ruminations; may be classified as minimal risk based on the severity of the depressive symptoms  Follow-up Information    [facility] Follow  up on 02/05/2018.   Why:  2:30PM with Crystal.  Contact information: [address] [place] [address] [phone]        [facility] Call in 3 day(s).   Why:  [facility] will contact you with an intake appt date.  Please call them within three days of discharge if you have not received a call from them.  Tell them you were [facility] for ACT [facility] on 01/21/[facility]. Contact information: [address] [place] [address] [phone]           Plan Of Care/Follow-up recommendations:  Activity:  as tolerated Diet:  as tolerated   Treatment Plan Summary:  Continue with current treatment plan:  -Schizophrenia - Continue fluphenazine (prolixin) 5 mg po bid.    -EPS. - Continue Benztropine 0.5 mg po bid.  -Anxiety. - Continue Hydroxyzine 50 mg po prn Q 6 hours prn.  -Insomnia. - Continue Trazodone 100 mg po Q hs prn, (may repeat x 1).  Asthma. - Continue Albuterol inhaler 108 (90 base) 2 puffs Q 6 hrs prn per inhalation.  Back pain:      - Continue Lidoderm patch 5% topical every 12 as needed              -The discharge disposition: 01/21/2018 into care of mother.    Mariel CraftSHEILA M MAURER, MD 01/22/2018, 11:32 AM

## 2018-01-22 NOTE — Progress Notes (Signed)
  [facility] Discharge Plan :  Will you be returning to the same living situation after discharge:  No. Will be staying with mother. At discharge, do you have transportation home?: Yes,  mother Do you have the ability to pay for your medications: Yes,  medicaid  Release of information consent forms completed and in the chart;  Patient's signature needed at discharge.  Patient to Follow up at: Follow-up Information    [facility] up on 02/05/2018.   Why:  2:30PM with Crystal.  Contact information: [address] [place] [address] [phone]        [facility] Call in 3 day(s).   Why:  [facility] will contact you with an intake appt date.  Please call them within three days of discharge if you have not received a call from them.  Tell them you were referred for ACT services by [facility] on 01/21/18. Contact information: [address] [place] [id number] [phone]           Next level of care provider has access to [facility] Link:no  Safety Planning and Suicide Prevention discussed: Yes,  with mother  Have you used any form of tobacco in the last 30 days? (Cigarettes, Smokeless Tobacco, Cigars, and/or Pipes): Yes  Has patient been referred to the Quitline?: Patient refused referral  Patient has been referred for addiction treatment: N/A  [name], Brytnee Bechler Jon, LCSW 01/22/2018, 10:01 AM

## 2018-01-22 NOTE — Progress Notes (Signed)
CSW was able to contact pt mother, Odella Aquasonya Ahnor, by phone.  She can pick pt up at 11am.  CSW updated her on the plan for ACT team through [facility] and also that his follow up with Dr Jannifer FranklinAkintayo remains scheduled in case the ACT authorization process goes slowly. Garner NashGregory Naithen Rivenburg, MSW, LCSW Clinical Social Worker 01/22/2018 8:17 AM

## 2018-02-14 ENCOUNTER — Other Ambulatory Visit: Payer: Self-pay

## 2018-02-14 ENCOUNTER — Encounter (HOSPITAL_COMMUNITY): Payer: Self-pay | Admitting: *Deleted

## 2018-02-14 ENCOUNTER — Emergency Department (HOSPITAL_COMMUNITY)
Admission: EM | Admit: 2018-02-14 | Discharge: 2018-02-14 | Disposition: A | Discharge status: Other Acute Inpatient Hospital | Payer: Medicaid Other | Attending: Emergency Medicine | Admitting: Emergency Medicine

## 2018-02-14 ENCOUNTER — Inpatient Hospital Stay (HOSPITAL_COMMUNITY)
Admission: AD | Admit: 2018-02-14 | Discharge: 2018-02-24 | DRG: 885 | Disposition: A | Discharge status: Home / Self Care | Payer: Medicaid Other | Source: Intra-hospital | Attending: Psychiatry | Admitting: Psychiatry

## 2018-02-14 DIAGNOSIS — F17210 Nicotine dependence, cigarettes, uncomplicated: Secondary | ICD-10-CM | POA: Diagnosis present

## 2018-02-14 DIAGNOSIS — G4700 Insomnia, unspecified: Secondary | ICD-10-CM | POA: Diagnosis present

## 2018-02-14 DIAGNOSIS — F329 Major depressive disorder, single episode, unspecified: Secondary | ICD-10-CM | POA: Diagnosis present

## 2018-02-14 DIAGNOSIS — Z590 Homelessness: Secondary | ICD-10-CM

## 2018-02-14 DIAGNOSIS — F200 Paranoid schizophrenia: Secondary | ICD-10-CM | POA: Insufficient documentation

## 2018-02-14 DIAGNOSIS — F419 Anxiety disorder, unspecified: Secondary | ICD-10-CM | POA: Diagnosis not present

## 2018-02-14 DIAGNOSIS — F17200 Nicotine dependence, unspecified, uncomplicated: Secondary | ICD-10-CM | POA: Insufficient documentation

## 2018-02-14 DIAGNOSIS — Z046 Encounter for general psychiatric examination, requested by authority: Secondary | ICD-10-CM | POA: Insufficient documentation

## 2018-02-14 DIAGNOSIS — F4310 Post-traumatic stress disorder, unspecified: Secondary | ICD-10-CM | POA: Diagnosis present

## 2018-02-14 DIAGNOSIS — Z79899 Other long term (current) drug therapy: Secondary | ICD-10-CM | POA: Insufficient documentation

## 2018-02-14 DIAGNOSIS — Z8659 Personal history of other mental and behavioral disorders: Secondary | ICD-10-CM | POA: Insufficient documentation

## 2018-02-14 LAB — ETHANOL

## 2018-02-14 LAB — COMPREHENSIVE METABOLIC PANEL
ALT: 19 U/L (ref 0–44)
AST: 20 U/L (ref 15–41)
Albumin: 4.7 g/dL (ref 3.5–5.0)
Alkaline Phosphatase: 56 U/L (ref 38–126)
Anion gap: 8 (ref 5–15)
BUN: 13 mg/dL (ref 6–20)
CALCIUM: 9.8 mg/dL (ref 8.9–10.3)
CHLORIDE: 106 mmol/L (ref 98–111)
CO2: 26 mmol/L (ref 22–32)
CREATININE: 0.84 mg/dL (ref 0.61–1.24)
Glucose, Bld: 106 mg/dL — ABNORMAL HIGH (ref 70–99)
Potassium: 3.8 mmol/L (ref 3.5–5.1)
Sodium: 140 mmol/L (ref 135–145)
Total Bilirubin: 0.5 mg/dL (ref 0.3–1.2)
Total Protein: 8.2 g/dL — ABNORMAL HIGH (ref 6.5–8.1)

## 2018-02-14 LAB — CBC
HCT: 44.9 % (ref 39.0–52.0)
HEMOGLOBIN: 15.2 g/dL (ref 13.0–17.0)
MCH: 29 pg (ref 26.0–34.0)
MCHC: 33.9 g/dL (ref 30.0–36.0)
MCV: 85.5 fL (ref 78.0–100.0)
Platelets: 227 10*3/uL (ref 150–400)
RBC: 5.25 MIL/uL (ref 4.22–5.81)
RDW: 14.1 % (ref 11.5–15.5)
WBC: 8.2 10*3/uL (ref 4.0–10.5)

## 2018-02-14 LAB — RAPID URINE DRUG SCREEN, HOSP PERFORMED
AMPHETAMINES: NOT DETECTED
Barbiturates: NOT DETECTED
Benzodiazepines: NOT DETECTED
Cocaine: NOT DETECTED
Opiates: NOT DETECTED
TETRAHYDROCANNABINOL: NOT DETECTED

## 2018-02-14 LAB — ACETAMINOPHEN LEVEL: Acetaminophen (Tylenol), Serum: <10 ug/mL — ABNORMAL LOW (ref 10–30)

## 2018-02-14 LAB — SALICYLATE LEVEL

## 2018-02-14 MED ORDER — BENZTROPINE MESYLATE 0.5 MG PO TABS
0.5000 mg | ORAL_TABLET | Freq: Two times a day (BID) | ORAL | Status: DC
  Administered 2018-02-14: 0.5 mg via ORAL
  Filled 2018-02-14: qty 1

## 2018-02-14 MED ORDER — FLUPHENAZINE DECANOATE 25 MG/ML IJ SOLN
25.0000 mg | Freq: Once | INTRAMUSCULAR | Status: AC
  Administered 2018-02-15: 25 mg via INTRAMUSCULAR
  Filled 2018-02-14: qty 1

## 2018-02-14 MED ORDER — ACETAMINOPHEN 325 MG PO TABS: 650.0000 mg | ORAL_TABLET | Freq: Four times a day (QID) | ORAL | Status: DC | PRN

## 2018-02-14 MED ORDER — TRAZODONE HCL 50 MG PO TABS: 50.0000 mg | ORAL_TABLET | Freq: Every evening | ORAL | Status: DC | PRN

## 2018-02-14 MED ORDER — LORAZEPAM 1 MG PO TABS: 1.0000 mg | ORAL_TABLET | Freq: Three times a day (TID) | ORAL | Status: DC | PRN

## 2018-02-14 MED ORDER — TRAZODONE HCL 100 MG PO TABS: 100.0000 mg | ORAL_TABLET | Freq: Every evening | ORAL | Status: DC | PRN

## 2018-02-14 MED ORDER — LORAZEPAM 1 MG PO TABS
1.0000 mg | ORAL_TABLET | Freq: Three times a day (TID) | ORAL | Status: DC | PRN
  Administered 2018-02-15 – 2018-02-17 (×3): 1 mg via ORAL
  Filled 2018-02-14 (×2): qty 1

## 2018-02-14 MED ORDER — ALUM & MAG HYDROXIDE-SIMETH 200-200-20 MG/5ML PO SUSP
30.0000 mL | ORAL | Status: DC | PRN
Start: 2018-02-14 — End: 2018-02-24

## 2018-02-14 MED ORDER — FLUPHENAZINE HCL 5 MG PO TABS
5.0000 mg | ORAL_TABLET | Freq: Two times a day (BID) | ORAL | Status: DC
  Administered 2018-02-14 – 2018-02-15 (×3): 5 mg via ORAL
  Filled 2018-02-14 (×8): qty 1

## 2018-02-14 MED ORDER — HYDROXYZINE HCL 25 MG PO TABS
25.0000 mg | ORAL_TABLET | Freq: Three times a day (TID) | ORAL | Status: DC | PRN
  Filled 2018-02-14: qty 10
  Filled 2018-02-14: qty 4

## 2018-02-14 MED ORDER — BENZTROPINE MESYLATE 0.5 MG PO TABS
0.5000 mg | ORAL_TABLET | Freq: Two times a day (BID) | ORAL | Status: DC
  Administered 2018-02-14 – 2018-02-17 (×7): 0.5 mg via ORAL
  Filled 2018-02-14 (×11): qty 1

## 2018-02-14 MED ORDER — FLUPHENAZINE HCL 5 MG PO TABS
5.0000 mg | ORAL_TABLET | Freq: Two times a day (BID) | ORAL | Status: DC
  Administered 2018-02-14: 5 mg via ORAL
  Filled 2018-02-14: qty 1

## 2018-02-14 MED ORDER — MAGNESIUM HYDROXIDE 400 MG/5ML PO SUSP: 30.0000 mL | Freq: Every day | ORAL | Status: DC | PRN

## 2018-02-14 MED ORDER — HYDROXYZINE HCL 25 MG PO TABS: 25.0000 mg | ORAL_TABLET | Freq: Three times a day (TID) | ORAL | Status: DC | PRN

## 2018-02-14 NOTE — BH Assessment (Signed)
[facility] Assessment Progress Note  This pt presents under IVC initiated by pt's mother.  This Clinical research associatewriter found IVC documents on chart with Return of Service on Findings and Custody Order left blank by Film/video editorserving officer.  A 08:32 I called [employer] and spoke to [place].  She agrees to dispatch law enforcement to complete Return of Service.  Pt's nurse has been notified.  Pt will be seen by psychiatry to complete First Examination later today.  Curtis Davenport, [place] Behavioral Health Coordinator [phone]

## 2018-02-14 NOTE — Tx Team (Signed)
Initial Treatment Plan 02/14/2018 6:34 PM Curtis RasherNadon Mance IRJ:[id number]    PATIENT STRESSORS:  Financial difficulties Medication change or noncompliance Occupational concerns   PATIENT STRENGTHS: Capable of independent living Motivation for treatment/growth Physical Health   PATIENT IDENTIFIED PROBLEMS: Alteration in sleep pattern "I have not been sleeping, I sleep only 2-3 hrs a night"  Alteration in mood (Anxiety) "I feel antsy"                    DISCHARGE CRITERIA:  Improved stabilization in mood, thinking, and/or behavior Verbal commitment to aftercare and medication compliance  PRELIMINARY DISCHARGE PLAN: Outpatient therapy Placement in alternative living arrangements  PATIENT/FAMILY INVOLVEMENT: This treatment plan has been presented to and reviewed with the patient, Curtis Davenport.  The patient have been given the opportunity to ask questions and make suggestions.  Sherryl MangesWesseh, Joey Lierman, RN 02/14/2018, 6:34 PM

## 2018-02-14 NOTE — Progress Notes (Signed)
Did not attend group 

## 2018-02-14 NOTE — BH Assessment (Addendum)
Assessment Note  Curtis Davenport is an 27 y.o. male, who presents involuntary and unaccompanied to [facility]. Clinician asked the pt, "what brought you to the hospital?" Pt reported, I guess the police did." Clinician asked the pt why the police brought him to the hospital? Pt reported, "I don't know why." Pt denies, SI, HI, AVH, self-injurious behaviors and access to weapons.  Pt was IVC'd by his mother. Per IVC paperwork: "Diagnosed paranoid schizophrenia, stated he wanted to lay on the train tracks, and threatened to kill himself. Location of respondent at this time is unknown, but mother believes he could be possibly be found at [address], [place], [place] ([facility])."  Pt denies, wanting to lay on the train tracks.   Clinician was unable to assess the following: "DSS involvement, stressors, sleep, appetite, family suicide history, contract for safety, anxiety level." Pt's UDS is negative.   Pt presents drowsy in scrubs with slow, soft speech with long pauses. Pt's eye contact was fair. Pt's mood was preoccupied. Pt's affect flat. Pt's thought process was tangential, circumstantial. Pt's judgement ws impaired. Pt is not oriented. Clinician asked the pt his birthday and he just looked in space. Pt's concentrations was fair. Pt's insight and impulse control are poor. During the assessment pt appeared to be responding to internal stimuli.   Diagnosis: F20.9 Schizophrenia.  Past Medical History:  Past Medical History:  Diagnosis Date  . Hallucinations   . PTSD (post-traumatic stress disorder)   . Schizophrenia (HCC)     History reviewed. No pertinent surgical history.  Family History: No family history on file.  Social History:  reports that he has been smoking.  He has been smoking about 0.50 packs per day. He has never used smokeless tobacco. He reports that he drinks alcohol. He reports that he does not use drugs.  Additional Social History:  Alcohol / Drug Use Pain Medications: See  MAR Prescriptions: See MAR Over the Counter: See MAR History of alcohol / drug use?: No history of alcohol / drug abuse(Pt UDS is negative. )  CIWA: CIWA-Ar BP: 123/81 Pulse Rate: 73 COWS:    Allergies:  Allergies  Allergen Reactions  . Shellfish Allergy Hives and Swelling    Home Medications:  (Not in a hospital admission)  OB/GYN Status:  No LMP for male patient.  General Assessment Data Location of Assessment: [facility] TTS Assessment: In system Is this a Tele or Face-to-Face Assessment?: Face-to-Face Is this an Initial Assessment or a Re-assessment for this encounter?: Initial Assessment Marital status: Single Living Arrangements: Other (Comment)(Homeless. ) Can pt return to current living arrangement?: Yes Admission Status: Involuntary Is patient capable of signing voluntary admission?: No Referral Source: Other([employer].) Insurance type: Medicaid.      Crisis Care Plan Living Arrangements: Other (Comment)(Homeless. ) Legal Guardian: Other:(Self. ) Name of Psychiatrist: UTA Name of Therapist: UTA  Education Status Is patient currently in school?: No(Per chart. ) Is the patient employed, unemployed or receiving disability?: Unemployed(Per chart. )  Risk to self with the past 6 months Suicidal Ideation: Yes-Currently Present(Per IVC however pt denies. ) Has patient been a risk to self within the past 6 months prior to admission? : Yes(Per chart.) Suicidal Intent: Yes-Currently Present(Per IVC.) Has patient had any suicidal intent within the past 6 months prior to admission? : Yes Is patient at risk for suicide?: Yes Suicidal Plan?: Yes-Currently Present Has patient had any suicidal plan within the past 6 months prior to admission? : No Specify Current  Suicidal Plan: Per IVC the pt wanted to lay on train tracts to kill himself.  Access to Means: Yes Specify Access to Suicidal Means: Pt has access to train tracks. What has been your use of drugs/alcohol within the  last 12 months?: UDS is negative.  Previous Attempts/Gestures: No(Pt denies. ) How many times?: 0(Pt denies.) Other Self Harm Risks: UTA Triggers for Past Attempts: None known Intentional Self Injurious Behavior: None(Pt denies.) Comment - Self Injurious Behavior: NA Family Suicide History: Unable to assess Recent stressful life event(s): (UTA) Persecutory voices/beliefs?: (UTA) Depression: Yes Depression Symptoms: Feeling worthless/self pity, Fatigue, Isolating, Guilt, Loss of interest in usual pleasures Substance abuse history and/or treatment for substance abuse?: No Suicide prevention information given to non-admitted patients: Not applicable  Risk to Others within the past 6 months Homicidal Ideation: No(Pt denies.) Does patient have any lifetime risk of violence toward others beyond the six months prior to admission? : No(Pt denies.) Thoughts of Harm to Others: No(Pt denies.) Current Homicidal Intent: No(Pt denies.) Current Homicidal Plan: No Access to Homicidal Means: No Identified Victim: NA History of harm to others?: No Assessment of Violence: None Noted Violent Behavior Description: NA Does patient have access to weapons?: No(Pt denies.) Criminal Charges Pending?: No Does patient have a court date: No Is patient on probation?: No  Psychosis Hallucinations: Auditory, Visual(Pt denies. ) Delusions: Unspecified  Mental Status Report Appearance/Hygiene: In scrubs Eye Contact: Fair Motor Activity: Unremarkable Speech: Slow, Soft Level of Consciousness: Drowsy Mood: Preoccupied Affect: Flat Anxiety Level: None Thought Processes: Tangential, Circumstantial Judgement: Impaired Orientation: Not oriented Obsessive Compulsive Thoughts/Behaviors: None  Cognitive Functioning Concentration: Fair Memory: Recent Impaired Is patient IDD: No Is patient DD?: No Insight: Poor Impulse Control: Poor Appetite: (UTA) Have you had any weight changes? : (UTA) Sleep: Unable  to Assess Vegetative Symptoms: Unable to Assess  ADLScreening [facility] Assessment Services) Patient's cognitive ability adequate to safely complete daily activities?: No Patient able to express need for assistance with ADLs?: Yes Independently performs ADLs?: Yes (appropriate for developmental age)  Prior Inpatient Therapy Prior Inpatient Therapy: Yes Prior Therapy Dates: 12/2017. Prior Therapy Facilty/Provider(s): [facility] Reason for Treatment: Psychosis.   Prior Outpatient Therapy Prior Outpatient Therapy: No(Pt denies. ) Prior Therapy Dates: NA Prior Therapy Facilty/Provider(s): NA Reason for Treatment: NA Does patient have an ACCT team?: No Does patient have Intensive In-House Services?  : No Does patient have [facility] services? : No Does patient have P4CC services?: No  ADL Screening (condition at time of admission) Patient's cognitive ability adequate to safely complete daily activities?: No Is the patient deaf or have difficulty hearing?: No Does the patient have difficulty seeing, even when wearing glasses/contacts?: No(Pt denies. ) Does the patient have difficulty concentrating, remembering, or making decisions?: Yes Patient able to express need for assistance with ADLs?: Yes Does the patient have difficulty dressing or bathing?: No Independently performs ADLs?: Yes (appropriate for developmental age) Does the patient have difficulty walking or climbing stairs?: No Weakness of Legs: None Weakness of Arms/Hands: None  Home Assistive Devices/Equipment Home Assistive Devices/Equipment: None    Abuse/Neglect Assessment (Assessment to be complete while patient is alone) Abuse/Neglect Assessment Can Be Completed: Unable to assess, patient is non-responsive or altered mental status     Advance Directives (For Healthcare) Does Patient Have a Medical Advance Directive?: No(Per chart.) Would patient like information on creating a medical advance directive?: No - Patient  declined(Per chart. )    Additional Information 1:1 In Past 12 Months?: No CIRT Risk:  No Elopement Risk: No Does patient have medical clearance?: Yes     Disposition: Nira ConnJason Berry, NP recommends inpatient treatment. Disposition discussed with Melvenia BeamShari, PA and Consuella LoseElaine, RN. TTS to seek placement.    Disposition Initial Assessment Completed for this Encounter: Yes Disposition of Patient: (inpatient treatment.)  On Site Evaluation by: Redmond Pullingreylese D Rhiannon Sassaman, MS, LPC, CRC. Reviewed with Physician: Melvenia BeamShari, [place] and Nira ConnJason Berry, NP.  Redmond Pullingreylese D Malakye Nolden 02/14/2018 5:51 AM   Redmond Pullingreylese D Anup Brigham, MS, [facility], [facility] Triage Specialist [phone]

## 2018-02-14 NOTE — ED Notes (Signed)
TTS assessment in progress. 

## 2018-02-14 NOTE — ED Notes (Signed)
Pt stated "[place]  [place]  Pt is alert but disoriented to month.  Pt was able to recall the year correctly.  When pt asked if he would like water, Sprite or ginger ale, pt stated "I'll take some of that fountain of you, water & Sprite."

## 2018-02-14 NOTE — ED Provider Notes (Signed)
[facility]-EMERGENCY DEPT Provider Note   CSN: [id number] Arrival date & time: 02/14/18  [id number]     History   Chief Complaint Chief Complaint  Patient presents with  . IVC    HPI Curtis Davenport is a 27 y.o. male.  Patient to ED by [employer] under IVC petition by mom for suicidal remarks about lying on the rail road tracks to kill himself. She had been absent from the home for a period of time and mom became concerned. The patient has no complaints. He denies SI/HI. When asked if he were hallucinating, he simply says "I'm here because I need some shut eye". No physical complaints.   The history is provided by the patient and the police. No language interpreter was used.    Past Medical History:  Diagnosis Date  . Hallucinations   . PTSD (post-traumatic stress disorder)   . Schizophrenia [facility](HCC)     Patient Active Problem List   Diagnosis Date Noted  . Schizophrenia, paranoid, chronic (HCC) 01/13/2018  . Psychosis (HCC)   . Paranoid schizophrenia (HCC) 01/10/2018  . Schizophrenia, undifferentiated (HCC)     History reviewed. No pertinent surgical history.      Home Medications    Prior to Admission medications   Medication Sig Start Date End Date Taking? Authorizing Provider  albuterol (PROVENTIL HFA;VENTOLIN HFA) 108 (90 Base) MCG/ACT inhaler Inhale 2 puffs into the lungs every 6 (six) hours as needed for shortness of breath. 01/21/18   Armandina StammerNwoko, Agnes I, NP  benztropine (COGENTIN) 0.5 MG tablet Take 1 tablet (0.5 mg total) by mouth 2 (two) times daily. For prevention of drug induced tremors 01/21/18   Armandina StammerNwoko, Agnes I, NP  fluPHENAZine (PROLIXIN) 5 MG tablet Take 1 tablet (5 mg total) by mouth 2 (two) times daily. For mood control 01/21/18   Armandina StammerNwoko, Agnes I, NP  hydrOXYzine (ATARAX/VISTARIL) 50 MG tablet Take 1 tablet (50 mg total) by mouth every 6 (six) hours as needed for anxiety. 01/21/18   Armandina StammerNwoko, Agnes I, NP  lidocaine (LIDODERM) 5 % Place 1 patch onto the skin  daily. Remove & Discard patch within 12 hours or as directed by MD: For pain 01/21/18   Armandina StammerNwoko, Agnes I, NP  traZODone (DESYREL) 100 MG tablet Take 1 tablet (100 mg total) by mouth at bedtime as needed for sleep. 01/21/18   Sanjuana KavaNwoko, Agnes I, NP    Family History No family history on file.  Social History Social History   Tobacco Use  . Smoking status: Current Some Day Smoker    Packs/day: 0.50  . Smokeless tobacco: Never Used  Substance Use Topics  . Alcohol use: Yes  . Drug use: No     Allergies   Shellfish allergy   Review of Systems Review of Systems  HENT: Negative.   Respiratory: Negative.   Cardiovascular: Negative.   Gastrointestinal: Negative.   Musculoskeletal: Negative.   Skin: Negative.   Neurological: Negative.   Psychiatric/Behavioral: Positive for suicidal ideas.       Per IVC petition.     Physical Exam Updated Vital Signs BP 123/81 (BP Location: Right Arm)   Pulse 73   Temp 98.8 F (37.1 C) (Oral)   Resp 16   Ht 5\' 11"  (1.803 m)   Wt 90.7 kg (200 lb)   SpO2 100%   BMI 27.89 kg/m   Physical Exam  Constitutional: He is oriented to person, place, and time. He appears well-developed and well-nourished.  HENT:  Head: Normocephalic.  Neck: Normal range of motion. Neck supple.  Cardiovascular: Normal rate and regular rhythm.  Pulmonary/Chest: Effort normal and breath sounds normal.  Abdominal: Soft. Bowel sounds are normal. There is no tenderness. There is no rebound and no guarding.  Musculoskeletal: Normal range of motion.  Neurological: He is alert and oriented to person, place, and time.  Skin: Skin is warm and dry. No rash noted.  Psychiatric: His affect is labile. He is withdrawn.     ED Treatments / Results  Labs (all labs ordered are listed, but only abnormal results are displayed) Labs Reviewed  COMPREHENSIVE METABOLIC PANEL - Abnormal; Notable for the following components:      Result Value   Glucose, Bld 106 (*)    Total Protein  8.2 (*)    All other components within normal limits  ACETAMINOPHEN LEVEL - Abnormal; Notable for the following components:   Acetaminophen (Tylenol), Serum <10 (*)    All other components within normal limits  ETHANOL  SALICYLATE LEVEL  CBC  RAPID URINE DRUG SCREEN, HOSP PERFORMED    EKG None  Radiology No results found.  Procedures Procedures (including critical care time)  Medications Ordered in ED Medications  LORazepam (ATIVAN) tablet 1 mg (has no administration in time range)  benztropine (COGENTIN) tablet 0.5 mg (has no administration in time range)  fluPHENAZine (PROLIXIN) tablet 5 mg (has no administration in time range)  traZODone (DESYREL) tablet 100 mg (has no administration in time range)     Initial Impression / Assessment and Plan / ED Course  I have reviewed the triage vital signs and the nursing notes.  Pertinent labs & imaging results that were available during my care of the patient were reviewed by me and considered in my medical decision making (see chart for details).     Patient here under IVC petition for SI per mom. History of paranoid schizophrenia, has been missing from moms house for several days. Unknown whether he is taking his medications. The patient does not contribute to history.   TTS consultation requested to assist in disposition.    Per TTS consultation the patient meets inpatient criteria and placement is being sought.  Final Clinical Impressions(s) / ED Diagnoses   Final diagnoses:  None   1. IVC for SI 2. History of paranoid schizophrenia  ED Discharge Orders    None       Danne Harbor 02/14/18 1610    Azalia Bilis, MD 02/17/18 [phone]

## 2018-02-14 NOTE — H&P (Addendum)
Psychiatric Admission Assessment Adult  Patient Identification: Curtis Davenport MRN:  [id number] Date of Evaluation:  02/14/2018 Chief Complaint:  SCHIZOPHRENIA Principal Diagnosis: Schizophrenia, paranoid, chronic ([place]) Diagnosis:   Patient Active Problem List   Diagnosis Date Noted  . Schizophrenia, paranoid, chronic ([place]) [F20.0] 01/13/2018  . Psychosis ([place]) [F29]   . Paranoid schizophrenia ([place]) [F20.0] 01/10/2018  . 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 male, who presents involuntary and unaccompanied to [facility]. Clinician asked the pt, "what brought you to the hospital?" Pt reported, I guess the police did." Clinician asked the pt why the police brought him to the hospital? Pt reported, "I don't know why." Pt denies, SI, HI, AVH, self-injurious behaviors and access to weapons. Pt was IVC'd by his mother. Per IVC paperwork: "Diagnosed paranoid schizophrenia, stated he wanted to lay on the train tracks, and threatened to kill himself. Location of respondent at this time is unknown, but mother believes he could be possibly be found at [place], [place], [place] ([facility])."  Pt denies, wanting to lay on the train tracks.   Clinician was unable to assess the following: "DSS involvement, stressors, sleep, appetite, family suicide history, contract for safety, anxiety level." Pt's UDS is negative.   Pt presents drowsy in scrubs with slow, soft speech with long pauses. Pt's eye contact was fair. Pt's mood was preoccupied. Pt's affect flat. Pt's thought process was tangential, circumstantial. Pt's judgement ws impaired. Pt is not oriented. Clinician asked the pt his birthday and he just looked in space. Pt's concentrations was fair. Pt's insight and impulse control are poor. During the assessment pt appeared to be responding to internal stimuli.    During evaluation on the unit:  Upon initial interview, pt  is vague, circumstantial, and minimizing. He is generally cooperative with the interview, but he is overall a poor historian. When asked why he was brought to the hospital, he shares, "I need some shut eye. I guess the police bought me. " Pt is currently residing at a [employer], and mom reports becoming concerned after he did not show up for a few days. He denies illicit substance use aside from use of tobacco about 0.25 ppd. UDS is negative.    7/27/2019Nadon seen resting in bed.  Patient is awake alert oriented to self and place only.  Reports" I am tired" reports receiving a recent injection and would like to get rest patient reports I do not want to answer any questions at this time.  Patient denies homicidal suicidal ideations with head-nodding denies auditory visual hallucinations head-nodding (left to right) patient's isolate to his room.  Will attempt to reassess.  Support encouragement reassurance was provided   Associated Signs/Symptoms: Depression Symptoms:  anxiety, (Hypo) Manic Symptoms:  Delusions, Distractibility, Hallucinations, Impulsivity, Irritable Mood, Labiality of Mood, Anxiety Symptoms:  NA Psychotic Symptoms:  Delusions, Hallucinations: Auditory Ideas of Reference, Paranoia, PTSD Symptoms: NA Total Time spent with patient: 1 hour  Past Psychiatric History:  - dx of schizophrenia - about 5 previous inpt stays with last admission about 1 month ago - Current outpatient at [facility],  - Denies hx of suicide attempt  Is the patient at risk to self? Yes.    Has the patient been a risk to self in the past 6 months? Yes.    Has the patient been a risk to self within the distant past? Yes.    Is the  patient a risk to others? Yes.    Has the patient been a risk to others in the past 6 months? Yes.    Has the patient been a risk to others within the distant past? Yes.     Prior Inpatient Therapy:  [facility] x1, most recent in  June 2019; [facility]  05/2016 Prior Outpatient Therapy:   [facility] under [facility] care [facility])  Alcohol Screening:   Substance Abuse History in the last 12 months:  No. Consequences of Substance Abuse: NA Previous Psychotropic Medications: Yes  Abilify, Cogentin, Invega Sustenna 234, Risperdal 52m po BID, Prolixin, Haldol, Celexa Psychological Evaluations: Yes  Past Medical History:  Past Medical History:  Diagnosis Date  . Hallucinations   . PTSD (post-traumatic stress disorder)   . Schizophrenia ([place]    No past surgical history on file. Family History: No family history on file. Family Psychiatric  History: Pt is not aware of family psychiatric history. Tobacco Screening:   Social History: [place] was staying with his grandmother until about 1 week ago, and [place] He is unemployed. He has completed some college. He denies legal and trauma history.  Social History   Substance and Sexual Activity  Alcohol Use Yes     Social History   Substance and Sexual Activity  Drug Use No    Additional Social History:     Allergies:   Allergies  Allergen Reactions  . Shellfish Allergy Hives and Swelling   Lab Results:  Results for orders placed or performed during the hospital encounter of 02/14/18 (from the past 48 hour(s))  Comprehensive metabolic panel     Status: Abnormal   Collection Time: 02/14/18  3:06 AM  Result Value Ref Range   Sodium 140 135 - 145 mmol/L   Potassium 3.8 3.5 - 5.1 mmol/L   Chloride 106 98 - 111 mmol/L   CO2 26 22 - 32 mmol/L   Glucose, Bld 106 (H) 70 - 99 mg/dL   BUN 13 6 - 20 mg/dL   Creatinine, Ser 0.84 0.61 - 1.24 mg/dL   Calcium 9.8 8.9 - 10.3 mg/dL   Total Protein 8.2 (H) 6.5 - 8.1 g/dL   Albumin 4.7 3.5 - 5.0 g/dL   AST 20 15 - 41 U/L   ALT 19 0 - 44 U/L   Alkaline Phosphatase 56 38 - 126 U/L   Total Bilirubin 0.5 0.3 - 1.2 mg/dL   GFR calc non Af Amer >60 >60  mL/min   GFR calc Af Amer >60 >60 mL/min    Comment: (NOTE) The eGFR has been calculated using the CKD EPI equation. This calculation has not been validated in all clinical situations. eGFR's persistently <60 mL/min signify possible Chronic Kidney Disease.    Anion gap 8 5 - 15    Comment: Performed at [facility] [address], [place] [id number] Ethanol     Status: None   Collection Time: 02/14/18  3:06 AM  Result Value Ref Range   Alcohol, Ethyl (B) <10 <10 mg/dL    Comment: Performed at [facility] [address] [place] [address] Salicylate level     Status: None   Collection Time: 02/14/18  3:06 AM  Result Value Ref Range   Salicylate Lvl <<6.22.8 - 30.0 mg/dL    Comment: [facility]formed at [facility] [address], [place] [address]  Acetaminophen level     Status: Abnormal   Collection Time: 02/14/18  3:06 AM  Result Value Ref Range   Acetaminophen (Tylenol), Serum <10 (L) 10 - 30 ug/mL    Comment: Performed at [facility], [place] [address] [place], [place] [address]  cbc     Status: None   Collection Time: 02/14/18  3:06 AM  Result Value Ref Range   WBC 8.2 4.0 - 10.5 K/uL   RBC 5.25 4.22 - 5.81 MIL/uL   Hemoglobin 15.2 13.0 - 17.0 g/dL   HCT 44.9 39.0 - 52.0 %   MCV 85.5 78.0 - 100.0 fL   MCH 29.0 26.0 - 34.0 pg   MCHC 33.9 30.0 - 36.0 g/dL   RDW 14.1 11.5 - 15.5 %   Platelets 227 150 - 400 K/uL    Comment: Performed at [facility], [place] [address], [place], [place] [address]  Rapid urine drug screen (hospital performed)     Status: None   Collection Time: 02/14/18  3:06 AM  Result Value Ref Range   Opiates NONE DETECTED NONE DETECTED   Cocaine NONE DETECTED NONE DETECTED   Benzodiazepines NONE DETECTED NONE DETECTED   Amphetamines NONE DETECTED NONE DETECTED   Tetrahydrocannabinol NONE DETECTED NONE DETECTED   Barbiturates NONE  DETECTED NONE DETECTED    Comment: (NOTE) DRUG SCREEN FOR MEDICAL PURPOSES ONLY.  IF CONFIRMATION IS NEEDED FOR ANY PURPOSE, NOTIFY LAB WITHIN 5 DAYS. LOWEST DETECTABLE LIMITS FOR URINE DRUG SCREEN Drug Class                     Cutoff (ng/mL) Amphetamine and metabolites    1000 Barbiturate and metabolites    200 Benzodiazepine                 629 Tricyclics and metabolites     300 Opiates and metabolites        300 Cocaine and metabolites        300 THC                            50 Performed at [facility], [place] [address] [place], [place] [address]     Blood Alcohol level:  Lab Results  Component Value Date   ETH <10 02/14/2018   ETH <10 65/09/5463    Metabolic Disorder Labs:  Lab Results  Component Value Date   HGBA1C 5.6 01/15/2018   MPG 114.02 01/15/2018   Lab Results  Component Value Date   PROLACTIN 44.7 (H) 01/15/2018   Lab Results  Component Value Date   CHOL 213 (H) 01/15/2018   TRIG 221 (H) 01/15/2018   HDL 60 01/15/2018   CHOLHDL 3.6 01/15/2018   VLDL 44 (H) 01/15/2018   LDLCALC 109 (H) 01/15/2018    Current Medications: No current facility-administered medications for this encounter.    PTA Medications: Medications Prior to Admission  Medication Sig Dispense Refill Last Dose  . albuterol (PROVENTIL HFA;VENTOLIN HFA) 108 (90 Base) MCG/ACT inhaler Inhale 2 puffs into the lungs every 6 (six) hours as needed for shortness of breath. (Patient not taking: Reported on 02/14/2018)   Not Taking at Unknown time  . benztropine (COGENTIN) 0.5 MG tablet Take 1 tablet (0.5 mg total) by mouth 2 (two) times daily. For prevention of drug induced tremors 60 tablet 0 Past Week at Unknown time  . cetirizine (ZYRTEC) 5 MG  tablet Take 5 mg by mouth daily.  3 Past Week at Unknown time  . fluPHENAZine (PROLIXIN) 5 MG tablet Take 1 tablet (5 mg total) by mouth 2 (two) times daily. For mood control 60 tablet 0 Past Week at Unknown time  . fluticasone  (FLONASE) 50 MCG/ACT nasal spray Place 2 sprays into both nostrils daily.  2 Past Week at Unknown time  . hydrOXYzine (ATARAX/VISTARIL) 50 MG tablet Take 1 tablet (50 mg total) by mouth every 6 (six) hours as needed for anxiety. 60 tablet 0 Past Week at Unknown time  . lidocaine (LIDODERM) 5 % Place 1 patch onto the skin daily. Remove & Discard patch within 12 hours or as directed by MD: For pain 30 patch 0 Past Week at Unknown time  . traZODone (DESYREL) 100 MG tablet Take 1 tablet (100 mg total) by mouth at bedtime as needed for sleep. 30 tablet 0 Past Week at Unknown time    Musculoskeletal: Strength & Muscle Tone: within normal limits Gait & Station: normal Patient leans: N/A  Psychiatric Specialty Exam: Physical Exam  Nursing note and vitals reviewed. Constitutional: He appears well-developed.  Cardiovascular: Normal rate.  Neurological: He is alert.  Psychiatric: He has a normal mood and affect. His behavior is normal.    Review of Systems  Psychiatric/Behavioral: Positive for hallucinations. Negative for depression, substance abuse and suicidal ideas. The patient is not nervous/anxious and does not have insomnia.   All other systems reviewed and are negative.   There were no vitals taken for this visit.There is no height or weight on file to calculate BMI.  General Appearance: Disheveled  Eye Contact:  Fair  Speech:  Clear and Coherent and Normal Rate  Volume:  Normal  Mood:  Depressed  Affect:  Flat, Inappropriate and Full Range  Thought Process:  Coherent, Disorganized and Descriptions of Associations: Loose  Orientation:  Full (Time, Place, and Person)  Thought Content:  Delusions, Rumination, Tangential and appears to be repsonding to internal stimuli  Suicidal Thoughts:  No  Homicidal Thoughts:  No  Memory:  Immediate;   Fair Recent;   Fair Remote;   Fair  Judgement:  Poor  Insight:  Lacking  Psychomotor Activity:  Normal  Concentration:  Concentration: Fair   Recall:  [employer] of Knowledge:  Fair  Language:  Fair  Akathisia:  No  Handed:    AIMS (if indicated):     Assets:  Resilience Social Support  ADL's:  Intact  Cognition:  WNL  Sleep:      Treatment Plan Summary: Daily contact with patient to assess and evaluate symptoms and progress in treatment and Medication management   Plan: Review of chart, vital signs, medications, and notes.  1-Admit for crisis management and stabilization. Estimated length of stay 5-7 days past his current stay of 1  2-Individual and group therapy encouraged  3-Medication management for depression and anxiety to reduce current symptoms to base line and improve the patient's overall level of functioning: Medications reviewed with the patient and Trazodone added for sleep issues and Vistaril 25 mg every six hours PRN anxiety. See below  4-Coping skills for depression, substance abuse, anger issues, and anxiety developing--  5-Continue crisis stabilization and management  6-Address health issues--monitoring vital signs, stable  7-Treatment plan in progress to prevent relapse of depression, angry outbursts, and anxiety  8-Psychosocial education regarding relapse prevention and self-care  9-Health care follow up as needed for any health concerns  10-Will need to obtain  consent fro collateral from mom.  Observation Level/Precautions:  15 minute checks  Laboratory:  CBC Chemistry Profile HbAIC UDS UA  Psychotherapy:  Encourage participation in groups and therapeutic milieu   Medications: Restart prolixin 58m po BID. Continue cogentin 0.536mpo BID. Continue vistaril 504mo q6h prn anxiety. Continue trazodone 100m60m qhs (may repeat x1 PRN). Continue agitation protocol with zydis/ativan/geodon.  Consultations:  CSW and Psychiatrist   Discharge Concerns:  Safety, stabilization, and risk of access to medication and medication stabilization   Estimated LOS: 5-7 days  Other:     Physician Treatment Plan for  Primary Diagnosis: Schizophrenia, paranoid, chronic (HCC)Joesng Term Goal(s): Improvement in symptoms so as ready for discharge  Short Term Goals: Ability to identify and develop effective coping behaviors will improve  Physician Treatment Plan for Secondary Diagnosis: Principal Problem:   Schizophrenia, paranoid, chronic (HCC)[place] Term Goal(s): Improvement in symptoms so as ready for discharge  Short Term Goals: Ability to identify changes in lifestyle to reduce recurrence of condition will improve  I certify that inpatient services furnished can reasonably be expected to improve the patient's condition.    TakiNanci PinaP 7/26/20194:47 PM  I have discussed case with NP and have met with patient  Agree with NP note and assessment  27 y73r old male, single , reports he is currently homeless. He  was admitted under commitment reporting that he has a history of schizophrenia and had threatened suicide by laying down on train tracks. In ED he presented with disorganized thought process and appearing to be responding to internal stimuli  Patient is [facility] to [facility] was recently admitted to inpatient unit from 6/25 through 7/2 . At the time was admitted for worsening psychosis, responding to internal stimuli, and poor self care. Was diagnosed with Schizophrenia, was discharged on Prolixin 5 mgrs BID. Patient presents as a vague historian , currently denies depression or having had suicidal ideations. Denies depression.  States " the police brought me because it is their duty to protect all of us".[place]ore[place] auditory hallucinations ( see below). States he had been taking medications prior to admission. Denies drug or alcohol abuse and admission UDS and BAL are negative. Dx- Schizophrenia by history Plan- Inpatient admission. Prolixin 5 mgrs BID. Cogentin 0.5 mgr BID. Ativan PRN for anxiety as needed

## 2018-02-14 NOTE — Progress Notes (Signed)
Admission DAR Note: Pt is a 27 y/o [facility]AM [facility]. Pt does have a diagnosis of Paranoid Schizophrenia. Presents anxious /restless on initial encounter. Denies SI, HI, AVH and pain at this time. Appears to be responding to internal stimuli at time of assessment (inappropriates laughter, whispers), hypervigilant, paranoid (scanning room, refused to sign some papers). Speech is soft but disorganized with loose associations. Pt noted comparing himself with the "[place]icked [place], that was a movie, so I don't know I'm here, I'm not a witchcraft" when asked about reason of admission. Per nursing report; pt was brought in by mother under IVC status after threatening to lay on train tracks to kill himself". Skin assessment done and belongings searched as per protocol. Items deemed contraband secured in locker. Pt has areas of dry patches on his skin (Eczema), escoriated from scratching. Unit orientation completed, routines discussed and care plan reviewed with pt. Q 15 minutes safety checks initiated without self harm gestures / outburst. POC continues for safety and mood stability.

## 2018-02-14 NOTE — BH Assessment (Signed)
[facility]  Per Juanetta BeetsJacqueline Norman, DO, this pt requires psychiatric hospitalization.  Malva LimesLinsey Strader, RN, [id number]; [facility] will be ready to receive pt at 14:00.  Pt presents under IVC initiated by pt's mother, and upheld by Dr Sharma CovertNorman, and [facility].  Pt's nurse, Zella BallRobin, has been notified, and agrees to call report to [phone].  Pt is to be transported via Patent examinerlaw enforcement.   [name] Colen Eltzroth, [place] Behavioral Health Coordinator [phone]

## 2018-02-14 NOTE — ED Notes (Signed)
Bed: WA29 Expected date:  Expected time:  Means of arrival:  Comments: [employer] 27 yo male cooperative IVC

## 2018-02-14 NOTE — ED Triage Notes (Signed)
Pt brought under IVC by [employer]. As stated on IVC paperwork:  Pt is dx'd with schizophrenia, wanted to lay on the train tracks and threatened to kill himself.

## 2018-02-15 DIAGNOSIS — F200 Paranoid schizophrenia: Principal | ICD-10-CM

## 2018-02-15 DIAGNOSIS — G4700 Insomnia, unspecified: Secondary | ICD-10-CM

## 2018-02-15 DIAGNOSIS — F419 Anxiety disorder, unspecified: Secondary | ICD-10-CM

## 2018-02-15 DIAGNOSIS — F17210 Nicotine dependence, cigarettes, uncomplicated: Secondary | ICD-10-CM

## 2018-02-15 MED ORDER — TRAZODONE HCL 50 MG PO TABS
50.0000 mg | ORAL_TABLET | Freq: Every evening | ORAL | Status: DC | PRN
  Administered 2018-02-15 – 2018-02-17 (×3): 50 mg via ORAL
  Filled 2018-02-15 (×2): qty 1

## 2018-02-15 MED ORDER — FLUPHENAZINE HCL 5 MG PO TABS
5.0000 mg | ORAL_TABLET | Freq: Every day | ORAL | Status: DC
  Administered 2018-02-16: 5 mg via ORAL
  Filled 2018-02-15 (×3): qty 1

## 2018-02-15 NOTE — [facility] Group Notes (Signed)
[facility] Group Notes:  (Nursing)  Date:  02/15/2018  Time: 430 PM Type of Therapy:  Nurse Education  Participation Level:  Did Not Attend  Shela NevinValerie S Dietrich Ke 02/15/2018, 7:35 PM

## 2018-02-15 NOTE — Progress Notes (Signed)
Did not attend group 

## 2018-02-15 NOTE — Plan of Care (Signed)
D: Patient presents guarded, distant. He has some thought blocking, and delayed responses. He seems paranoid, as he did not trust staff that he had Prolixin Dec Injection ordered for this morning, but he did eventually agree to receive the med. He slept well last night, and appeared fairly lethargic this morning. Patient denies SI/HI/AVH.  A: Patient checked q15 min, and checks reviewed. Reviewed medication changes with patient and educated on side effects. Educated patient on importance of attending group therapy sessions and educated on several coping skills. Encouarged participation in milieu through recreation therapy and attending meals with peers. Support and encouragement provided. Fluids offered. R: Patient receptive to education on medications, and is medication compliant. Patient contracts for safety on the unit.

## 2018-02-15 NOTE — Progress Notes (Signed)
D.  Pt did not come out of room to attend group, but did come up later for snacks.  Pt appeared to have a fixed smile and laughed periodically while asking questions about Crime Stoppers.  Pt wanted to know if it was connected to the government.  He had many questions about his birth certificate, his identity here and how we got it, his cousin's birth certificate, and about how identity is verified.  Pt appears paranoid and guarded.  He did agree to take some medication before bed.  Pt denies SI/HI/AVH at this time.  A.  Support and encouragement offered, medication given as ordered  R.  Pt remains safe on the unit, will continue to monitor.

## 2018-02-15 NOTE — [facility] Group Notes (Signed)
LCSW Group Therapy Note  02/15/2018   10:00--11:00am   Type of Therapy and Topic:  Group Therapy: Anger Cues and Responses  Participation Level:  Did Not Attend   Description of Group:   In this group, patients learned how to recognize the physical, cognitive, emotional, and behavioral responses they have to anger-provoking situations.  They identified a recent time they became angry and how they reacted.  They analyzed how their reaction was possibly beneficial and how it was possibly unhelpful.  The group discussed a variety of healthier coping skills that could help with such a situation in the future.  Deep breathing was practiced briefly.  Therapeutic Goals: 1. Patients will remember their last incident of anger and how they felt emotionally and physically, what their thoughts were at the time, and how they behaved. 2. Patients will identify how their behavior at that time worked for them, as well as how it worked against them. 3. Patients will explore possible new behaviors to use in future anger situations. 4. Patients will learn that anger itself is normal and cannot be eliminated, and that healthier reactions can assist with resolving conflict rather than worsening situations.  Summary of Patient Progress: N/A  Therapeutic Modalities:   Cognitive Behavioral Therapy  Teryn Boerema D Omer Puccinelli    

## 2018-02-15 NOTE — [facility] Suicide Risk Assessment (Signed)
[facility]   Nursing information obtained from:    Demographic factors:  Male Current Mental Status:  Suicidal ideation indicated by others Loss Factors:  Decrease in vocational status, Financial problems / change in socioeconomic status Historical Factors:  NA Risk Reduction Factors:  Positive therapeutic relationship, Positive social support, Positive coping skills or problem solving skills  Total Time spent with patient: 45 minutes Principal Problem: Schizophrenia, paranoid, chronic (HCC) Diagnosis:   Patient Active Problem List   Diagnosis Date Noted  . Schizophrenia, paranoid (HCC) [F20.0] 02/14/2018  . Schizophrenia, paranoid, chronic (HCC) [F20.0] 01/13/2018  . Psychosis (HCC) [F29]   . Paranoid schizophrenia (HCC) [F20.0] 01/10/2018  . Schizophrenia, undifferentiated (HCC) [F20.3]    Subjective Data:   Continued Clinical Symptoms:  Alcohol Use Disorder Identification Test Final Score (AUDIT): 0 The "Alcohol Use Disorders Identification Test", Guidelines for Use in Primary Care, Second Edition.  World Science writerHealth Organization [facility](WHO). Score between 0-7:  no or low risk or alcohol related problems. Score between 8-15:  moderate risk of alcohol related problems. Score between 16-19:  high risk of alcohol related problems. Score 20 or above:  warrants further diagnostic evaluation for alcohol dependence and treatment.   CLINICAL FACTORS:  27 year old male, single , reports he is currently homeless. He  was admitted under commitment reporting that he has a history of schizophrenia and had threatened suicide by laying down on train tracks. In ED he presented with disorganized thought process and appearing to be responding to internal stimuli  [facility] and was recently admitted to inpatient unit from 6/25 through 7/2 . At the time was admitted for worsening psychosis, responding to internal stimuli, and poor self care. Was diagnosed with  Schizophrenia, was discharged on Prolixin 5 mgrs BID. Patient presents as a vague historian , currently denies depression or having had suicidal ideations. Denies depression.  States " the police brought me because it is their duty to protect all of us". Endorses auditory hallucinations ( see below). States he had been taking medications prior to admission. Denies drug or alcohol abuse and admission UDS and BAL are negative. Dx- Schizophrenia by history Plan- Inpatient admission. Prolixin 5 mgrs BID. Cogentin 0.5 mgr BID. Ativan PRN for anxiety as needed    Musculoskeletal: Strength & Muscle Tone: within normal limits Gait & Station: normal Patient leans: N/A  Psychiatric Specialty Exam: Physical Exam  ROS denies headache, no chest pain, no shortness of breath, no vomiting , no fever   Blood pressure 121/68, pulse (!) 55, temperature 98.6 F (37 C), temperature source Oral, resp. rate 20, height 6' (1.829 m), weight 89.4 kg (197 lb), SpO2 100 %.Body mass index is 26.72 kg/m.  General Appearance: Fairly Groomed  Eye Contact:  Fair  Speech:  Normal Rate  Volume:  Decreased  Mood:  states " I am  OK", denies depression  Affect:  flat, somewhat anxious   Thought Process:  Disorganized and Descriptions of Associations: Tangential  Orientation:  Other:  alert [place] Summ[place], reported month as August   Thought Content:  ackowledges hallucinations, states " I hear the government, telling me who's governor of what state,". Also states he hears the " [place]"  Suicidal Thoughts:  No denies suicidal or self injurious ideations, denies homicidal or violent ideations   Homicidal Thoughts:  No  Memory:  recent and remote fair   Judgement:  Fair  Insight:  Fair  Psychomotor Activity:  Decreased, currently  in bed   Concentration:  Concentration: Fair and Attention Span: Fair  Recall:  [employer] of Knowledge:  Fair  Language:  Fair  Akathisia:  Negative  Handed:   Right  AIMS (if indicated):     Assets:  Desire for Improvement Resilience  ADL's:  Fair   Cognition:  Impaired,  Mild  Sleep:  Number of Hours: 4.25      COGNITIVE FEATURES THAT CONTRIBUTE TO RISK:  Closed-mindedness and Loss of executive function    SUICIDE RISK:   Moderate:  Frequent suicidal ideation with limited intensity, and duration, some specificity in terms of plans, no associated intent, good self-control, limited dysphoria/symptomatology, some risk factors present, and identifiable protective factors, including available and accessible social support.  PLAN OF CARE: Patient will be admitted to inpatient psychiatric unit for stabilization and safety. Will provide and encourage milieu participation. Provide medication management and maked adjustments as needed.  Will follow daily.    I certify that inpatient services furnished can reasonably be expected to improve the patient's condition.   Craige Cotta, MD 02/15/2018, 5:51 PM

## 2018-02-15 NOTE — Progress Notes (Signed)
D: Writer attempted to talk to the pt while at the med window. Was able to ask several of the assessment questions. However, pt walked away before finishing in order to get a snack in the dayroom. After a couple of mins the writer went into the dayroom in an attempt to speak with the pt. Writer asked if pt had any questions or concerns that had not been addressed. Pt looked at the writer, "rolled" his eyes, got up and walked out of the dayroom. Pt continued to look at the writer as he left the room.   A:  Support and encouragement was offered. 15 min checks continued for safety.  R: Pt remains safe.

## 2018-02-16 MED ORDER — HYDROCERIN EX CREA
TOPICAL_CREAM | Freq: Two times a day (BID) | CUTANEOUS | Status: DC
  Administered 2018-02-16 – 2018-02-17 (×2): via TOPICAL
  Administered 2018-02-17 – 2018-02-18 (×2): 1 via TOPICAL
  Administered 2018-02-18 – 2018-02-24 (×9): via TOPICAL
  Filled 2018-02-16: qty 113

## 2018-02-16 NOTE — Progress Notes (Signed)
Adult Psychoeducational Group Note  Date:  02/16/2018 Time:  10:38 PM  Group Topic/Focus:  Wrap-Up Group:   The focus of this group is to help patients review their daily goal of treatment and discuss progress on daily workbooks.  Participation Level:  Minimal  Participation Quality:  Appropriate  Affect:  Appropriate  Cognitive:  Oriented  Insight: Lacking  Engagement in Group:  Engaged  Modes of Intervention:  Socialization and Support  Additional Comments:  Patient attended and participated in group tonight. He reports that today he relaxed all day. He stated that she feel like he is doing better. He went for his medication and attended groups.  Lita MainsFrancis, [facility] 02/16/2018, 10:38 PM
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
## 2018-02-16 NOTE — Plan of Care (Signed)
D: Patient presents guarded, suspicious. He is isolative and has minimal responses with some thought blocking. He appears suspicious of his medications, but was agreeable to taking them this morning. His sleep is poor, and requested medication last night that helped. His appetite is fair, energy normal, and concentration poor. He rates his depression, anxiety, and sense of hopelessness 0/10. Patient denies SI/HI/AVH.  A: Patient checked q15 min, and checks reviewed. Reviewed medication changes with patient and educated on side effects. Educated patient on importance of attending group therapy sessions and educated on several coping skills. Encouarged participation in milieu through recreation therapy and attending meals with peers. Support and encouragement provided. Fluids offered. R: Patient receptive to education on medications, and is medication compliant. Patient contracts for safety on the unit. "Accepting help" is his goal today.

## 2018-02-16 NOTE — [facility] Group Notes (Signed)
[facility] Group Notes:  (Nursing/MHT/Case Management/Adjunct)  Date:  02/16/2018  Time:  6:40 PM  Type of Therapy:  Nurse Education  Participation Level:  Did Not Attend  Summary of Progress/Problems: Patient did not attend group on relaxation and meditation.  Kirstie MirzaJonathan C Izayah Miner 02/16/2018, 6:40 PM

## 2018-02-16 NOTE — Progress Notes (Addendum)
[facility] MD Progress Note  02/16/2018 11:08 AM Curtis Davenport  MRN:  [id number] Subjective: Rashee seen standing at the nurses stations for morning medication.  Patient is awake alert oriented to self and place.  Patient continues to isolate and is short and abrupt with responses.  Reports "I do not know" to most questions.  Denies suicidal or homicidal ideations.  Denies auditory visual hallucinations.  Patient is a poor historian. patient daily hygiene was encouraged.  NP to prescribe reassurance for dry skin.  Patient received Prolixin 25 mg IM on 02/15/2018 patient reports tolerating medications well denies any medication side effects.  Support encouragement reassurance was provided.  History: Per admission assessment note Curtis Davenport an 27 y.o.male, who presents involuntary and unaccompanied to [facility].Clinician asked the pt, "what brought you to the hospital?"Pt reported, I guess the police did." Clinician asked the pt why the police brought him to the hospital? Pt reported, "I don't know why." Pt denies, SI, HI, AVH, self-injurious behaviors and access to weapons. Pt was IVC'd by his mother. Per IVC paperwork: "Diagnosed paranoid schizophrenia, stated he wanted to lay on the train tracks, and threatened to kill himself. Location of respondent at this time is unknown, but mother believes he could be possibly be found at [address], [place], [place] ([facility])." Pt denies, wanting to lay on the train tracks.     Principal Problem: Schizophrenia, paranoid, chronic (HCC) Diagnosis:   Patient Active Problem List   Diagnosis Date Noted  . Schizophrenia, paranoid (HCC) [F20.0] 02/14/2018  . Schizophrenia, paranoid, chronic (HCC) [F20.0] 01/13/2018  . Psychosis (HCC) [F29]   . Paranoid schizophrenia (HCC) [F20.0] 01/10/2018  . Schizophrenia, undifferentiated (HCC) [F20.3]    Total Time spent with patient: 20 minutes  Past Psychiatric History: Previous inpatient admission.  Diagnosed  schizophrenia paranoia.  And psychosis  Past Medical History:  Past Medical History:  Diagnosis Date  . Hallucinations   . PTSD (post-traumatic stress disorder)   . Schizophrenia (HCC)    History reviewed. No pertinent surgical history. Family History: History reviewed. No pertinent family history. Family Psychiatric  History:  Social History:  Social History   Substance and Sexual Activity  Alcohol Use Yes     Social History   Substance and Sexual Activity  Drug Use No    Social History   Socioeconomic History  . Marital status: Single    Spouse name: Not on file  . Number of children: Not on file  . Years of education: Not on file  . Highest education level: Not on file  Occupational History  . Not on file  Social Needs  . Financial resource strain: Not on file  . Food insecurity:    Worry: Not on file    Inability: Not on file  . Transportation needs:    Medical: Not on file    Non-medical: Not on file  Tobacco Use  . Smoking status: Current Some Day Smoker    Packs/day: 0.50  . Smokeless tobacco: Never Used  Substance and Sexual Activity  . Alcohol use: Yes  . Drug use: No  . Sexual activity: Yes  Lifestyle  . Physical activity:    Days per week: Not on file    Minutes per session: Not on file  . Stress: Not on file  Relationships  . Social connections:    Talks on phone: Not on file    Gets together: Not on file    Attends religious service: Not on file  Active member of club or organization: Not on file    Attends meetings of clubs or organizations: Not on file    Relationship status: Not on file  Other Topics Concern  . Not on file  Social History Narrative  . Not on file   Additional Social History:                         Sleep: Fair  Appetite:  Fair  Current Medications: Current Facility-Administered Medications  Medication Dose Route Frequency Provider Last Rate Last Dose  . acetaminophen (TYLENOL) tablet 650 mg  650  mg Oral Q6H PRN Laveda Abbe, NP      . alum & mag hydroxide-simeth (MAALOX/MYLANTA) 200-200-20 MG/5ML suspension 30 mL  30 mL Oral Q4H PRN Laveda Abbe, NP      . benztropine (COGENTIN) tablet 0.5 mg  0.5 mg Oral BID Laveda Abbe, NP   0.5 mg at 02/16/18 0820  . fluPHENAZine (PROLIXIN) tablet 5 mg  5 mg Oral QHS Cobos, Fernando A, MD      . hydrOXYzine (ATARAX/VISTARIL) tablet 25 mg  25 mg Oral TID PRN Laveda Abbe, NP      . LORazepam (ATIVAN) tablet 1 mg  1 mg Oral Q8H PRN Laveda Abbe, NP   1 mg at 02/15/18 2312  . magnesium hydroxide (MILK OF MAGNESIA) suspension 30 mL  30 mL Oral Daily PRN Laveda Abbe, NP      . traZODone (DESYREL) tablet 50 mg  50 mg Oral QHS PRN Cobos, Rockey Situ, MD   50 mg at 02/15/18 2312    Lab Results: No results found for this or any previous visit (from the past 48 hour(s)).  Blood Alcohol level:  Lab Results  Component Value Date   ETH <10 02/14/2018   ETH <10 01/10/2018    Metabolic Disorder Labs: Lab Results  Component Value Date   HGBA1C 5.6 01/15/2018   MPG 114.02 01/15/2018   Lab Results  Component Value Date   PROLACTIN 44.7 (H) 01/15/2018   Lab Results  Component Value Date   CHOL 213 (H) 01/15/2018   TRIG 221 (H) 01/15/2018   HDL 60 01/15/2018   CHOLHDL 3.6 01/15/2018   VLDL 44 (H) 01/15/2018   LDLCALC 109 (H) 01/15/2018    Physical Findings: AIMS: Facial and Oral Movements Muscles of Facial Expression: None, normal Lips and Perioral Area: None, normal Jaw: None, normal Tongue: None, normal,Extremity Movements Upper (arms, wrists, hands, fingers): None, normal Lower (legs, knees, ankles, toes): None, normal, Trunk Movements Neck, shoulders, hips: None, normal, Overall Severity Severity of abnormal movements (highest score from questions above): None, normal Incapacitation due to abnormal movements: None, normal Patient's awareness of abnormal movements (rate only patient's  report): No Awareness, Dental Status Current problems with teeth and/or dentures?: No Does patient usually wear dentures?: No  CIWA:  CIWA-Ar Total: 0 COWS:  COWS Total Score: 0  Musculoskeletal: Strength & Muscle Tone: within normal limits Gait & Station: normal Patient leans: N/A  Psychiatric Specialty Exam: Physical Exam  Vitals reviewed. Constitutional: He appears well-developed.  Neurological: He is alert.  Psychiatric: He has a normal mood and affect.    Review of Systems  Psychiatric/Behavioral: Negative for depression and suicidal ideas. The patient is not nervous/anxious and does not have insomnia.   All other systems reviewed and are negative.   Blood pressure 122/70, pulse 68, temperature 98.6 F (37 C), temperature source Oral,  resp. rate 16, height 6' (1.829 m), weight 89.4 kg (197 lb), SpO2 100 %.Body mass index is 26.72 kg/m.  General Appearance: Casual and Guarded malodorous  Eye Contact:  Minimal  Speech:  Clear and Coherent  Volume:  Normal  Mood:  Anxious, Depressed and Dysphoric  Affect:  Flat  Thought Process:  Coherent  Orientation:  Other:  person and place  Thought Content:  Paranoid Ideation  Suicidal Thoughts:  No  Homicidal Thoughts:  No  Memory:  Immediate;   Fair Recent;   Fair Remote;   Fair  Judgement:  Fair  Insight:  Fair  Psychomotor Activity:  Normal  Concentration:  Concentration: Fair  Recall:  [employer]  Fund of Knowledge:  Fair  Language:  Fair  Akathisia:  No  Handed:  Right  AIMS (if indicated):     Assets:  Communication Skills Desire for Improvement Resilience Social Support  ADL's:  Intact  Cognition:  WNL  Sleep:  Number of Hours: 5.25     Treatment Plan Summary: Daily contact with patient to assess and evaluate symptoms and progress in treatment and Medication management   Continue with current treatment plan on 02/16/2017 as listed below except were noted  Mood stabilization:  Continue  Orolixin 5mg  PR  QHS  Fluphenazine 25mg  IM was given 02/15/2018  Insomnia  Continue trazodone 50 mg p.o. Nightly EPS:  Continue Cogentin 5 mg p.o. twice daily Anxiety:  Continue Ativan 1 mg p.o. every 8 hours as needed  Continue Vistaril 25 mg p.o. 3 times daily PRN  CSW to continue working on discharge disposition Patient encouraged to attend daily group sessions with active and engaged participation throughout the milieu   Oneta Rackanika N Lewis, NP 02/16/2018, 11:08 AM    ..Agree with NP Progress Note

## 2018-02-16 NOTE — Progress Notes (Signed)
D.  Pt pleasant on approach, appears to have fixed smile during conversation.  Pt denies complaints at this time.  Pt was positive for evening wrap up group, minimal interaction on unit.  Pt went to bed immediately after group, ordered medication taken to his room.  Pt denies SI/HI/AVH at this time.  A.  Support and encouragement offered, medication given as ordered  R.  Pt remains safe on the unit, will continue to monitor.

## 2018-02-17 MED ORDER — FLUPHENAZINE HCL 5 MG PO TABS
5.0000 mg | ORAL_TABLET | ORAL | Status: DC
  Administered 2018-02-17 – 2018-02-24 (×14): 5 mg via ORAL
  Filled 2018-02-17: qty 1
  Filled 2018-02-17: qty 6
  Filled 2018-02-17 (×14): qty 1
  Filled 2018-02-17: qty 6
  Filled 2018-02-17: qty 1

## 2018-02-17 MED ORDER — BENZTROPINE MESYLATE 0.5 MG PO TABS
0.5000 mg | ORAL_TABLET | Freq: Two times a day (BID) | ORAL | Status: DC | PRN
  Filled 2018-02-17: qty 6

## 2018-02-17 NOTE — Progress Notes (Signed)
Recreation Therapy Notes  Date: 7.29.19 Time: 1000 Location: 500 Hall Dayroom  Group Topic: Wellness  Goal Area(s) Addresses:  Patient will define components of whole wellness. Patient will verbalize benefit of whole wellness.  Intervention:  Music  Activity: Exercise.  LRT lead patients in a series of stretches.  LRT then gave each patient the opportunity to lead the group in an appropriate exercise of their choice.  The group completed four rounds of exercises before concluding with a cool down.    Education: Wellness, Building control surveyorDischarge Planning.   Education Outcome: Acknowledges education/In group clarification offered/Needs additional education.   Clinical Observations/Feedback:  Pt did not attend group.    Caroll RancherMarjette Elisandra Deshmukh, LRT/CTRS     Lillia AbedLindsay, Maham Quintin A 02/17/2018 11:25 AM

## 2018-02-17 NOTE — Progress Notes (Signed)
Nursing Progress Note: 7p-7a D: Pt currently presents with a paranoid/delusional/thought blocking affect and behavior. Pt states "I am doing just fine." Not interacting with the milieu. Pt reports good sleep during the previous night with current medication regimen. Pt did not attend wrap-up group.  A: Pt provided with medications per providers orders. Pt's labs and vitals were monitored throughout the night. Pt supported emotionally and encouraged to express concerns and questions. Pt educated on medications.  R: Pt's safety ensured with 15 minute and environmental checks. Pt currently denies SI, HI, and AVH. Pt verbally contracts to seek staff if SI,HI, or AVH occurs and to consult with staff before acting on any harmful thoughts. Will continue to monitor.

## 2018-02-17 NOTE — Plan of Care (Signed)
Problem: Coping: Goal: Coping ability will improve Outcome: Progressing  Problem: Safety: Goal: Ability to redirect hostility and anger into socially appropriate behaviors will improve Outcome: Progressing D: Pt is awake in bed at this time. Presents disorganized, suspicious, preoccupied with inappropriate laughter while pacing hall. Denies SI, HI, AVH and pain when assessed. Remains medication compliant with increased prompts. Guarded, isolative to his room, refused to attend scheduled groups despite multiple prompts. Rates his depression, anxiety and hopelessness all 0/10. Goal this shift "money". Reports he's eating and sleeping well with good concentration and hyper-energy level. Stable housing options (group homes) discussed with pt by MD this shift in preparation for discharge. A: Treatment compliance encouraged. Scheduled medications given as ordered with verbal education and effects monitored. Q 15 minutes safety checks maintained without self harm gestures or outburst thus far. R: Pt tolerates all PO intake well. Continues to require prompts towards treatment compliance. Denies adverse drug reactions / concerns at this time. Safety maintained on and off unit.

## 2018-02-17 NOTE — Progress Notes (Signed)
[facility] MD Progress Note  02/17/2018 7:35 PM Curtis Davenport  MRN:  [id number] Subjective: Chart reviewed. Curtis Davenport seen, he is bright and smiling.  He denies suicidal intent or a plan to lay on the railroad tracks, but "maybe was looking for a place to sleep".  He denies HI and AVH, but is seen smiling, talking and laughing to himself.  He reports he received and injection and shows a band-aid on his left deltoid.  He is awake alert oriented to self and place.  Patient continues to isolate when not out for medications and meals.  He is short and abrupt with responses.  Reports "I do not know" to most questions.   Patient is a poor historian. He is agreeable to living in a group home,  Patient received Prolixin 25 mg IM on 02/15/2018 patient reports tolerating medications well denies any medication side effects.  Support encouragement reassurance was provided. Consider group home with assurance on medication management.   History: Per admission assessment note Curtis Davenport an 27 y.o.male, who presents involuntary and unaccompanied to [facility].Clinician asked the pt, "what brought you to the hospital?"Pt reported, I guess the police did." Clinician asked the pt why the police brought him to the hospital? Pt reported, "I don't know why." Pt denies, SI, HI, AVH, self-injurious behaviors and access to weapons. Pt was IVC'd by his mother. Per IVC paperwork: "Diagnosed paranoid schizophrenia, stated he wanted to lay on the train tracks, and threatened to kill himself. Location of respondent at this time is unknown, but mother believes he could be possibly be found at [address], [place], [place] ([facility])." Pt denies, wanting to lay on the train tracks.     Principal Problem: Schizophrenia, paranoid, chronic (HCC) Diagnosis:   Patient Active Problem List   Diagnosis Date Noted  . Schizophrenia, paranoid (HCC) [F20.0] 02/14/2018  . Schizophrenia, paranoid, chronic (HCC) [F20.0] 01/13/2018  . Psychosis  (HCC) [F29]   . Paranoid schizophrenia (HCC) [F20.0] 01/10/2018  . Schizophrenia, undifferentiated (HCC) [F20.3]    Total Time spent with patient: 35 min  Past Psychiatric History: Previous inpatient admission.  Diagnosed schizophrenia paranoia.  And psychosis  Past Medical History:  Past Medical History:  Diagnosis Date  . Hallucinations   . PTSD (post-traumatic stress disorder)   . Schizophrenia (HCC)    History reviewed. No pertinent surgical history. Family History: History reviewed. No pertinent family history. Family Psychiatric  History:  See H&P Social History:  Social History   Substance and Sexual Activity  Alcohol Use Yes     Social History   Substance and Sexual Activity  Drug Use No    Social History   Socioeconomic History  . Marital status: Single    Spouse name: Not on file  . Number of children: Not on file  . Years of education: Not on file  . Highest education level: Not on file  Occupational History  . Not on file  Social Needs  . Financial resource strain: Not on file  . Food insecurity:    Worry: Not on file    Inability: Not on file  . Transportation needs:    Medical: Not on file    Non-medical: Not on file  Tobacco Use  . Smoking status: Current Some Day Smoker    Packs/day: 0.50  . Smokeless tobacco: Never Used  Substance and Sexual Activity  . Alcohol use: Yes  . Drug use: No  . Sexual activity: Yes  Lifestyle  . Physical  activity:    Days per week: Not on file    Minutes per session: Not on file  . Stress: Not on file  Relationships  . Social connections:    Talks on phone: Not on file    Gets together: Not on file    Attends religious service: Not on file    Active member of club or organization: Not on file    Attends meetings of clubs or organizations: Not on file    Relationship status: Not on file  Other Topics Concern  . Not on file  Social History Narrative  . Not on file   Additional Social History:              see H&P            Sleep: Fair  Appetite:  Fair  Current Medications: Current Facility-Administered Medications  Medication Dose Route Frequency Provider Last Rate Last Dose  . acetaminophen (TYLENOL) tablet 650 mg  650 mg Oral Q6H PRN Laveda Abbe, NP      . alum & mag hydroxide-simeth (MAALOX/MYLANTA) 200-200-20 MG/5ML suspension 30 mL  30 mL Oral Q4H PRN Laveda Abbe, NP      . benztropine (COGENTIN) tablet 0.5 mg  0.5 mg Oral BID Laveda Abbe, NP   0.5 mg at 02/17/18 1725  . fluPHENAZine (PROLIXIN) tablet 5 mg  5 mg Oral QHS Cobos, Rockey Situ, MD   5 mg at 02/16/18 2108  . hydrocerin (EUCERIN) cream   Topical BID Oneta Rack, NP   1 application at 02/17/18 1725  . hydrOXYzine (ATARAX/VISTARIL) tablet 25 mg  25 mg Oral TID PRN Laveda Abbe, NP      . LORazepam (ATIVAN) tablet 1 mg  1 mg Oral Q8H PRN Laveda Abbe, NP   1 mg at 02/16/18 2108  . magnesium hydroxide (MILK OF MAGNESIA) suspension 30 mL  30 mL Oral Daily PRN Laveda Abbe, NP      . traZODone (DESYREL) tablet 50 mg  50 mg Oral QHS PRN Cobos, Rockey Situ, MD   50 mg at 02/16/18 2108    Lab Results: No results found for this or any previous visit (from the past 48 hour(s)).  Blood Alcohol level:  Lab Results  Component Value Date   ETH <10 02/14/2018   ETH <10 01/10/2018    Metabolic Disorder Labs: Lab Results  Component Value Date   HGBA1C 5.6 01/15/2018   MPG 114.02 01/15/2018   Lab Results  Component Value Date   PROLACTIN 44.7 (H) 01/15/2018   Lab Results  Component Value Date   CHOL 213 (H) 01/15/2018   TRIG 221 (H) 01/15/2018   HDL 60 01/15/2018   CHOLHDL 3.6 01/15/2018   VLDL 44 (H) 01/15/2018   LDLCALC 109 (H) 01/15/2018    Physical Findings: AIMS: Facial and Oral Movements Muscles of Facial Expression: None, normal Lips and Perioral Area: None, normal Jaw: None, normal Tongue: None, normal,Extremity Movements Upper (arms,  wrists, hands, fingers): None, normal Lower (legs, knees, ankles, toes): None, normal, Trunk Movements Neck, shoulders, hips: None, normal, Overall Severity Severity of abnormal movements (highest score from questions above): None, normal Incapacitation due to abnormal movements: None, normal Patient's awareness of abnormal movements (rate only patient's report): No Awareness, Dental Status Current problems with teeth and/or dentures?: No Does patient usually wear dentures?: No  CIWA:  CIWA-Ar Total: 0 COWS:  COWS Total Score: 0  Musculoskeletal: Strength & Muscle Tone: within  normal limits Gait & Station: normal Patient leans: N/A  Psychiatric Specialty Exam: Physical Exam  Vitals reviewed. Constitutional: He appears well-developed.  Neurological: He is alert.  Psychiatric: He has a normal mood and affect.    Review of Systems  Psychiatric/Behavioral: Negative for depression and suicidal ideas. The patient is not nervous/anxious and does not have insomnia.   All other systems reviewed and are negative.   Blood pressure 122/74, pulse 68, temperature 98.5 F (36.9 C), temperature source Oral, resp. rate 16, height 6' (1.829 m), weight 89.4 kg (197 lb), SpO2 100 %.Body mass index is 26.72 kg/m.  General Appearance: Casual and Guarded malodorous  Eye Contact:  Minimal  Speech:  Clear and Coherent  Volume:  Normal  Mood:  Anxious, Depressed and Dysphoric  Affect:  Flat  Thought Process:  Coherent  Orientation:  Other:  person and place  Thought Content:  Paranoid Ideation  Suicidal Thoughts:  No  Homicidal Thoughts:  No  Memory:  Immediate;   Fair Recent;   Fair Remote;   Fair  Judgement:  Fair  Insight:  Fair  Psychomotor Activity:  Normal  Concentration:  Concentration: Fair  Recall:  [employer]Fair  Fund of Knowledge:  Fair  Language:  Fair  Akathisia:  No  Handed:  Right  AIMS (if indicated):     Assets:  Communication Skills Desire for Improvement Resilience Social  Support  ADL's:  Intact  Cognition:  WNL  Sleep:  Number of Hours: 5.75     Treatment Plan Summary: Daily contact with patient to assess and evaluate symptoms and progress in treatment and Medication management   Continue with current treatment plan on 02/16/2017 as listed below except were noted  Mood stabilization:  Increase  Prolixin 10 mg PO BID  Fluphenazine 25mg  IM was given 02/15/2018  Insomnia  Continue trazodone 50 mg p.o. Nightly EPS:  Continue Cogentin 5 mg p.o. twice daily PRN only  Anxiety:   Continue Ativan 1 mg p.o. every 8 hours as needed  Continue Vistaril 25 mg p.o. 3 times daily PRN  CSW to continue working on discharge disposition Patient encouraged to attend daily group sessions with active and engaged participation throughout the milieu  Patient received Prolixin 25 mg IM on 02/15/2018 patient reports tolerating medications well denies any medication side effects.  Support encouragement reassurance was provided. Consider group home with assurance on medication management.   Mariel CraftSHEILA M Miyuki Rzasa, MD 02/17/2018, 7:35 PM

## 2018-02-17 NOTE — Progress Notes (Signed)
Recreation Therapy Notes  Patient admitted to unit 7.26.19. Due to admission within last year, no new assessment conducted at this time. Last assessment conducted 6.25.19.  Patient reports being admitted by the police for safety reasons.  Patient reports having no stressors.  Patient reports he has no coping skills or no leisure interests.  Patient also reports no knowledge of community resources.  Patient reports his strengths are finances and homes.   Patient denies SI, HI, AVH at this time. Patient reports goal of getting an apartment.  Information found below from assessment conducted 6.25.19.     Coping Skills: Isolation, Sports, TV, Music, Exercise, Meditate, Deep Breathing, Talk, Art, Prayer, Avoidance, Hot Bath/Shower  Leisure Interests:  Basketball, Football, [employer]  Community Resources:  [place], Gym    [place] PrairieMarjette Shaneequa Davenport, LRT/CTRS    Caroll RancherLindsay, [name] 02/17/2018 2:16 PM

## 2018-02-17 NOTE — Tx Team (Signed)
Interdisciplinary Treatment and Diagnostic Plan Update  02/17/2018 Time of Session: [place] MRN: [id number]  Principal Diagnosis: Schizophrenia, paranoid, chronic ([place])  Secondary Diagnoses: Principal Problem:   Schizophrenia, paranoid, chronic ([place]) Active Problems:   Schizophrenia, paranoid ([place])   Current Medications:  Current Facility-Administered Medications  Medication Dose Route Frequency Provider Last Rate Last Dose  . acetaminophen (TYLENOL) tablet 650 mg  650 mg Oral Q6H PRN Ethelene Hal, NP      . alum & mag hydroxide-simeth (MAALOX/MYLANTA) 200-200-20 MG/5ML suspension 30 mL  30 mL Oral Q4H PRN Ethelene Hal, NP      . benztropine (COGENTIN) tablet 0.5 mg  0.5 mg Oral BID Ethelene Hal, NP   0.5 mg at 02/16/18 1727  . fluPHENAZine (PROLIXIN) tablet 5 mg  5 mg Oral QHS Cobos, Myer Peer, MD   5 mg at 02/16/18 2108  . hydrocerin (EUCERIN) cream   Topical BID Derrill Center, NP      . hydrOXYzine (ATARAX/VISTARIL) tablet 25 mg  25 mg Oral TID PRN Ethelene Hal, NP      . LORazepam (ATIVAN) tablet 1 mg  1 mg Oral Q8H PRN Ethelene Hal, NP   1 mg at 02/16/18 2108  . magnesium hydroxide (MILK OF MAGNESIA) suspension 30 mL  30 mL Oral Daily PRN Ethelene Hal, NP      . traZODone (DESYREL) tablet 50 mg  50 mg Oral QHS PRN Cobos, Myer Peer, MD   50 mg at 02/16/18 2108   PTA Medications: Medications Prior to Admission  Medication Sig Dispense Refill Last Dose  . albuterol (PROVENTIL HFA;VENTOLIN HFA) 108 (90 Base) MCG/ACT inhaler Inhale 2 puffs into the lungs every 6 (six) hours as needed for shortness of breath. (Patient not taking: Reported on 02/14/2018)   Not Taking at Unknown time  . benztropine (COGENTIN) 0.5 MG tablet Take 1 tablet (0.5 mg total) by mouth 2 (two) times daily. For prevention of drug induced tremors 60 tablet 0 Past Week at Unknown time  . cetirizine (ZYRTEC) 5 MG tablet Take 5 mg by mouth daily.  3  Past Week at Unknown time  . fluPHENAZine (PROLIXIN) 5 MG tablet Take 1 tablet (5 mg total) by mouth 2 (two) times daily. For mood control 60 tablet 0 Past Week at Unknown time  . fluticasone (FLONASE) 50 MCG/ACT nasal spray Place 2 sprays into both nostrils daily.  2 Past Week at Unknown time  . hydrOXYzine (ATARAX/VISTARIL) 50 MG tablet Take 1 tablet (50 mg total) by mouth every 6 (six) hours as needed for anxiety. 60 tablet 0 Past Week at Unknown time  . lidocaine (LIDODERM) 5 % Place 1 patch onto the skin daily. Remove & Discard patch within 12 hours or as directed by MD: For pain 30 patch 0 Past Week at Unknown time  . traZODone (DESYREL) 100 MG tablet Take 1 tablet (100 mg total) by mouth at bedtime as needed for sleep. 30 tablet 0 Past Week at Unknown time    Patient Stressors: Financial difficulties Medication change or noncompliance Occupational concerns  Patient Strengths: Capable of independent living Motivation for treatment/growth Physical Health  Treatment Modalities: Medication Management, Group therapy, Case management,  1 to 1 session with clinician, Psychoeducation, Recreational therapy.   Physician Treatment Plan for Primary Diagnosis: Schizophrenia, paranoid, chronic ([place]) Long Term Goal(s): Improvement in symptoms so as ready for discharge Improvement in symptoms so as ready for discharge   Short Term Goals: Ability to  identify and develop effective coping behaviors will improve Ability to identify changes in lifestyle to reduce recurrence of condition will improve  Medication Management: Evaluate patient's response, side effects, and tolerance of medication regimen.  Therapeutic Interventions: 1 to 1 sessions, Unit Group sessions and Medication administration.  Evaluation of Outcomes: Not Met  Physician Treatment Plan for Secondary Diagnosis: Principal Problem:   Schizophrenia, paranoid, chronic (HCC) Active Problems:   Schizophrenia, paranoid ([place])  Long  Term Goal(s): Improvement in symptoms so as ready for discharge Improvement in symptoms so as ready for discharge   Short Term Goals: Ability to identify and develop effective coping behaviors will improve Ability to identify changes in lifestyle to reduce recurrence of condition will improve     Medication Management: Evaluate patient's response, side effects, and tolerance of medication regimen.  Therapeutic Interventions: 1 to 1 sessions, Unit Group sessions and Medication administration.  Evaluation of Outcomes: Not Met   RN Treatment Plan for Primary Diagnosis: Schizophrenia, paranoid, chronic (HCC) Long Term Goal(s): Knowledge of disease and therapeutic regimen to maintain health will improve  Short Term Goals: Ability to remain free from injury will improve, Ability to verbalize feelings will improve, Ability to disclose and discuss suicidal ideas and Ability to identify and develop effective coping behaviors will improve  Medication Management: RN will administer medications as ordered by provider, will assess and evaluate patient's response and provide education to patient for prescribed medication. RN will report any adverse and/or side effects to prescribing provider.  Therapeutic Interventions: 1 on 1 counseling sessions, Psychoeducation, Medication administration, Evaluate responses to treatment, Monitor vital signs and CBGs as ordered, Perform/monitor CIWA, COWS, AIMS and Fall Risk screenings as ordered, Perform wound care treatments as ordered.  Evaluation of Outcomes: Not Met   LCSW Treatment Plan for Primary Diagnosis: Schizophrenia, paranoid, chronic ([place]) Long Term Goal(s): Safe transition to appropriate next level of care at discharge, Engage patient in therapeutic group addressing interpersonal concerns.  Short Term Goals: Engage patient in aftercare planning with referrals and resources, Increase ability to appropriately verbalize feelings and Increase skills for  wellness and recovery  Therapeutic Interventions: Assess for all discharge needs, 1 to 1 time with Social worker, Explore available resources and support systems, Assess for adequacy in community support network, Educate family and significant other(s) on suicide prevention, Complete Psychosocial Assessment, Interpersonal group therapy.  Evaluation of Outcomes: Not Met   Progress in Treatment: Attending groups: No. Participating in groups: No. New to unit. Continuing to assess.  Taking medication as prescribed: Yes. Toleration medication: Yes. Family/Significant other contact made: No, will contact:  family member if pt consents to collateral contact.  Patient understands diagnosis: Yes. Discussing patient identified problems/goals with staff: Yes. Medical problems stabilized or resolved: Yes. Denies suicidal/homicidal ideation: Yes Issues/concerns per patient self-inventory: No. Other: n/a  New problem(s) identified: No, Describe:  n/a  New Short Term/Long Term Goal(s): elimination of AVH,  medication management for mood stabilization; elimination of SI thoughts; development of comprehensive mental wellness/sobriety plan.   Patient Goals:  Help with anxiety and not sleeping.   Discharge Plan or Barriers: CSW assessing for appropriate referrals. [place] pamphlet, Mobile Crisis information provided to patient for additional community support and resources.   Reason for Continuation of Hospitalization: Anxiety Depression Hallucinations Medication stabilization Suicidal ideation  Estimated Length of Stay: Thursday, 02/20/18  Attendees: Patient: 02/17/2018 11:22 AM  Physician: Dr. Leverne Humbles MD 02/17/2018 11:22 AM  Nursing: Nicoletta Dress RN; Elizabeth RN 02/17/2018 11:22 AM  RN Care Manager:x 02/17/2018 11:22  AM  Social Worker: Janice Norrie LCSW 02/17/2018 11:22 AM  Recreational Therapist: x 02/17/2018 11:22 AM  Other: Lindell Spar NP; Benjamine Mola NP 02/17/2018 11:22 AM  Other:  02/17/2018  11:22 AM  Other: 02/17/2018 11:22 AM    Scribe for Treatment Team: Avelina Laine, LCSW 02/17/2018 11:22 AM

## 2018-02-18 MED ORDER — TRAZODONE HCL 100 MG PO TABS
100.0000 mg | ORAL_TABLET | Freq: Every evening | ORAL | Status: DC | PRN
  Administered 2018-02-18 – 2018-02-22 (×4): 100 mg via ORAL
  Filled 2018-02-18: qty 4
  Filled 2018-02-18: qty 1
  Filled 2018-02-18: qty 3
  Filled 2018-02-18: qty 1

## 2018-02-18 NOTE — Progress Notes (Signed)
Nursing Progress Note: 7p-7a D: Pt currently presents with a blocking/disorganized/paranoid/inappropriate laughing affect and behavior. Pt states "I am fine." Interacting appropriately with the milieu. Pt reports good sleep during the previous night with current medication regimen.  A: Pt provided with medications per providers orders. Pt's labs and vitals were monitored throughout the night. Pt supported emotionally and encouraged to express concerns and questions. Pt educated on medications.  R: Pt's safety ensured with 15 minute and environmental checks. Pt currently denies SI, HI, and AVH. Pt verbally contracts to seek staff if SI,HI, or AVH occurs and to consult with staff before acting on any harmful thoughts. Will continue to monitor.

## 2018-02-18 NOTE — Progress Notes (Signed)
D: Pt A & O to self and place. Presents with a fixed smile on approach, thought blocking when engaged longer. Denies SI, HI, AVH and pain when assessed. Pt requires multiple prompts to attend to scheduled unit groups and do his ADLs but to no avail. Observed pacing in hall this evening whispering to self with inappropriate laughter. A: Scheduled medications administered as ordered with verbal education and effects monitored. Continued encouragement and support offered throughout this shift. Q 15 minutes checks maintained without incident.  R: Pt remains medication compliant. Denies adverse drug reactions when assessed. Tolerates all PO intake well. Remains safe on and off unit.

## 2018-02-18 NOTE — Progress Notes (Signed)
[facility] MD Progress Note  02/18/2018 7:08 PM Curtis Davenport  MRN:  [id number] Subjective: Chart reviewed. Curtis Davenport seen, he laying in his bed and reports, "I am trying to get my mind together.  I need to plan for what is next."  Patient states that he sees death and angels.  When he sees death, he says, "what is it death?, and death tells him is he goes somewhere he will die, so I avoid that place so I don't die." He denies suicidal ideation or HI.  He reports he has not seen death since this morning.  He is awake alert oriented to self and place.  He is able to state the date with prompts for date and day of the week. Patient continues to isolate when not out for medications and meals.    Patient is a poor historian. He is agreeable to living in a group home after discharge.   Patient received Prolixin 25 mg IM on 02/15/2018 patient reports tolerating medications well denies any medication side effects.  Support encouragement reassurance was provided. Consider group home with assurance on medication management.   History: Per admission assessment note Curtis Davenport an 27 y.o.male, who presents involuntary and unaccompanied to [facility].Clinician asked the pt, "what brought you to the hospital?"Pt reported, I guess the police did." Clinician asked the pt why the police brought him to the hospital? Pt reported, "I don't know why." Pt denies, SI, HI, AVH, self-injurious behaviors and access to weapons. Pt was IVC'd by his mother. Per IVC paperwork: "Diagnosed paranoid schizophrenia, stated he wanted to lay on the train tracks, and threatened to kill himself. Location of respondent at this time is unknown, but mother believes he could be possibly be found at [address], [place], [place] ([facility])." Pt denies, wanting to lay on the train tracks.     Principal Problem: Schizophrenia, paranoid, chronic (HCC) Diagnosis:   Patient Active Problem List   Diagnosis Date Noted  . Schizophrenia, paranoid (HCC)  [F20.0] 02/14/2018  . Schizophrenia, paranoid, chronic (HCC) [F20.0] 01/13/2018  . Psychosis (HCC) [F29]   . Paranoid schizophrenia (HCC) [F20.0] 01/10/2018  . Schizophrenia, undifferentiated (HCC) [F20.3]    Total Time spent with patient: 35 min  Past Psychiatric History: Previous inpatient admission.  Diagnosed schizophrenia paranoia.  And psychosis  Past Medical History:  Past Medical History:  Diagnosis Date  . Hallucinations   . PTSD (post-traumatic stress disorder)   . Schizophrenia (HCC)    History reviewed. No pertinent surgical history. Family History: History reviewed. No pertinent family history. Family Psychiatric  History:  See H&P Social History:  Social History   Substance and Sexual Activity  Alcohol Use Yes     Social History   Substance and Sexual Activity  Drug Use No    Social History   Socioeconomic History  . Marital status: Single    Spouse name: Not on file  . Number of children: Not on file  . Years of education: Not on file  . Highest education level: Not on file  Occupational History  . Not on file  Social Needs  . Financial resource strain: Not on file  . Food insecurity:    Worry: Not on file    Inability: Not on file  . Transportation needs:    Medical: Not on file    Non-medical: Not on file  Tobacco Use  . Smoking status: Current Some Day Smoker    Packs/day: 0.50  . Smokeless tobacco: Never  Used  Substance and Sexual Activity  . Alcohol use: Yes  . Drug use: No  . Sexual activity: Yes  Lifestyle  . Physical activity:    Days per week: Not on file    Minutes per session: Not on file  . Stress: Not on file  Relationships  . Social connections:    Talks on phone: Not on file    Gets together: Not on file    Attends religious service: Not on file    Active member of club or organization: Not on file    Attends meetings of clubs or organizations: Not on file    Relationship status: Not on file  Other Topics Concern  .  Not on file  Social History Narrative  . Not on file   Additional Social History:             see H&P            Sleep: Fair  Appetite:  Fair  Current Medications: Current Facility-Administered Medications  Medication Dose Route Frequency Provider Last Rate Last Dose  . acetaminophen (TYLENOL) tablet 650 mg  650 mg Oral Q6H PRN Laveda Abbe, NP      . alum & mag hydroxide-simeth (MAALOX/MYLANTA) 200-200-20 MG/5ML suspension 30 mL  30 mL Oral Q4H PRN Laveda Abbe, NP      . benztropine (COGENTIN) tablet 0.5 mg  0.5 mg Oral BID PRN Mariel Craft, MD      . fluPHENAZine (PROLIXIN) tablet 5 mg  5 mg Oral BH-qamhs Mariel Craft, MD   5 mg at 02/18/18 0809  . hydrocerin (EUCERIN) cream   Topical BID Oneta Rack, NP      . hydrOXYzine (ATARAX/VISTARIL) tablet 25 mg  25 mg Oral TID PRN Laveda Abbe, NP      . LORazepam (ATIVAN) tablet 1 mg  1 mg Oral Q8H PRN Laveda Abbe, NP   1 mg at 02/17/18 2135  . magnesium hydroxide (MILK OF MAGNESIA) suspension 30 mL  30 mL Oral Daily PRN Laveda Abbe, NP      . traZODone (DESYREL) tablet 50 mg  50 mg Oral QHS PRN Cobos, Rockey Situ, MD   50 mg at 02/17/18 2135    Lab Results: No results found for this or any previous visit (from the past 48 hour(s)).  Blood Alcohol level:  Lab Results  Component Value Date   ETH <10 02/14/2018   ETH <10 01/10/2018    Metabolic Disorder Labs: Lab Results  Component Value Date   HGBA1C 5.6 01/15/2018   MPG 114.02 01/15/2018   Lab Results  Component Value Date   PROLACTIN 44.7 (H) 01/15/2018   Lab Results  Component Value Date   CHOL 213 (H) 01/15/2018   TRIG 221 (H) 01/15/2018   HDL 60 01/15/2018   CHOLHDL 3.6 01/15/2018   VLDL 44 (H) 01/15/2018   LDLCALC 109 (H) 01/15/2018    Physical Findings: AIMS: Facial and Oral Movements Muscles of Facial Expression: None, normal Lips and Perioral Area: None, normal Jaw: None, normal Tongue:  None, normal,Extremity Movements Upper (arms, wrists, hands, fingers): None, normal Lower (legs, knees, ankles, toes): None, normal, Trunk Movements Neck, shoulders, hips: None, normal, Overall Severity Severity of abnormal movements (highest score from questions above): None, normal Incapacitation due to abnormal movements: None, normal Patient's awareness of abnormal movements (rate only patient's report): No Awareness, Dental Status Current problems with teeth and/or dentures?: No Does patient usually wear  dentures?: No  CIWA:  CIWA-Ar Total: 0 COWS:  COWS Total Score: 0  Musculoskeletal: Strength & Muscle Tone: within normal limits Gait & Station: normal Patient leans: N/A  Psychiatric Specialty Exam: Physical Exam  Vitals reviewed. Constitutional: He appears well-developed.  Neurological: He is alert.  Psychiatric: He has a normal mood and affect.    Review of Systems  Psychiatric/Behavioral: Negative for depression and suicidal ideas. The patient is not nervous/anxious and does not have insomnia.   All other systems reviewed and are negative.   Blood pressure 101/86, pulse 98, temperature 97.8 F (36.6 C), temperature source Oral, resp. rate 20, height 6' (1.829 m), weight 89.4 kg (197 lb), SpO2 100 %.Body mass index is 26.72 kg/m.  General Appearance: Casual and Guarded malodorous  Eye Contact:  Minimal  Speech:  Clear and Coherent  Volume:  Normal  Mood:  Anxious, Depressed and Dysphoric  Affect:  Flat  Thought Process:  Coherent  Orientation:  Other:  person and place  Thought Content:  Paranoid Ideation  Suicidal Thoughts:  No  Homicidal Thoughts:  No  Memory:  Immediate;   Fair Recent;   Fair Remote;   Fair  Judgement:  Fair  Insight:  Fair  Psychomotor Activity:  Normal  Concentration:  Concentration: Fair  Recall:  [employer]Fair  Fund of Knowledge:  Fair  Language:  Fair  Akathisia:  No  Handed:  Right  AIMS (if indicated):     Assets:  Communication  Skills Desire for Improvement Resilience Social Support  ADL's:  Intact  Cognition:  WNL  Sleep:  Number of Hours: 4.5     Treatment Plan Summary: Daily contact with patient to assess and evaluate symptoms and progress in treatment and Medication management   Continue with current treatment plan on 02/16/2017 as listed below except were noted  Mood stabilization:  Increase  Prolixin 10 mg PO BID  Fluphenazine 25mg  IM was given 02/15/2018 Plan for Fluphenazine 50 mg IM prior to discharge.  Insomnia  Increase Trazodone 100 mg p.o. Nightly  EPS:  Continue Cogentin 5 mg p.o. twice daily PRN only  Anxiety:  Continue Vistaril 25 mg p.o. 3 times daily PRN  CSW to continue working on discharge disposition Patient encouraged to attend daily group sessions with active and engaged participation throughout the milieu  Patient received Prolixin 25 mg IM on 02/15/2018 patient reports tolerating medications well denies any medication side effects.  Support encouragement reassurance was provided. Consider group home with assurance on medication management.   Mariel CraftSHEILA M MAURER, MD 02/18/2018, 7:08 PM

## 2018-02-18 NOTE — Progress Notes (Signed)
Recreation Therapy Notes  Date: 7.30.19 Time: 1000 Location: 500 Hall Dayroom  Group Topic: Coping Skills  Goal Area(s) Addresses:  Patient will be able to identify positive coping skills. Patient will be able to identify benefits of using positive coping skills post d/c.  Intervention: Worksheet, pencils  Activity: Mindmap.  Patients were given a blank mind map.  LRT and patients filled in the first 8 boxes together (death, anxiety, anger, depression, PTSD, chronic/terminal illness, finances and cultural differences).  Patients were to then come up with coping skills for each situation individually.  The group would then come back together and LRT would fill in the coping skills on the board.  Education: PharmacologistCoping Skills, Building control surveyorDischarge Planning.   Education Outcome: Acknowledges understanding/In group clarification offered/Needs additional education.   Clinical Observations/Feedback: Pt did not attend group.     Caroll RancherMarjette Zainah Steven, LRT/CTRS         Lillia AbedLindsay, Domani Bakos A 02/18/2018 11:28 AM

## 2018-02-19 MED ORDER — TUBERCULIN PPD 5 UNIT/0.1ML ID SOLN
5.0000 [IU] | Freq: Once | INTRADERMAL | Status: AC
  Administered 2018-02-19: 5 [IU] via INTRADERMAL

## 2018-02-19 NOTE — Progress Notes (Signed)
D: Pt visible in hall on initial encounter. A & O to self and place. Denies SI, HI, AVH and pain "no, not right now". Presents with fixed smile. Continues to need redirections to attend to his ADLs. Remains medication compliant. Rates his anxiety, depression and hopelessness all 5/10 on self inventory sheet. Goal this shift "is to focus". Continues to respond to internal stimuli. Sleep and appetite is good. Refuse to attend groups despite prompts. A: Emotional support offered to pt. Encouraged pt to voice concerns. All medications administered as ordered with verbal education and effects monitored. Safety checks maintained at Q 15 minutes intervals without self harm gestures.  R: Pt remains verbally redirectable. Compliant with medications when offered. Denies concerns. POC maintained for safety and mood stability.

## 2018-02-19 NOTE — [facility] Suicide Risk Assessment (Signed)
[facility] INPATIENT:  Family/Significant Other Suicide Prevention Education  Suicide Prevention Education:  Education Completed; Aura Dialsonya Amadi (pt's mother) [phone] has been identified by the patient as the family member/significant other with whom the patient will be residing, and identified as the person(s) who will aid the patient in the event of a mental health crisis (suicidal ideations/suicide attempt).  With written consent from the patient, the family member/significant other has been provided the following suicide prevention education, prior to the and/or following the discharge of the patient.  The suicide prevention education provided includes the following:  Suicide risk factors  Suicide prevention and interventions  National Suicide Hotline telephone number  [facility] assessment telephone number  [facility] Assistance 911  [facility] and/or Residential Mobile Crisis Unit telephone number  Request made of family/significant other to:  Remove weapons (e.g., guns, rifles, knives), all items previously/currently identified as safety concern.    Remove drugs/medications (over-the-counter, prescriptions, illicit drugs), all items previously/currently identified as a safety concern.  The family member/significant other verbalizes understanding of the suicide prevention education information provided.  The family member/significant other agrees to remove the items of safety concern listed above.  SPE and aftercare options discussed with pt's mother/legal guardian. Pt's mother shared that "this is the worst he's ever been. He has his own room at my daughter's house but is wanting freedom and stays at shelters to get that freedom I guess." She worried that his mental illness is progressing "he's not taking his medicine and he's not listening to me." She states that pt may return home at discharge and she will sign release for [facility] appt tonight when she  comes to visit him. She is supportive of pt being referred for group home/family care home placement as well. She is aware that this may not happen directly from hospital and is happy that the process will be started "if that's is what he wants." She confirmed that pt does not have access to weapons/firearms.   Rona RavensHeather S Arlan Birks LCSW 02/19/2018, 1:07 PM

## 2018-02-19 NOTE — Progress Notes (Addendum)
Nursing Progress Note: 7p-7a D: Pt currently presents with a blocking/disorganized/paranoid/inappropriate laughing affect and behavior. Interacting appropriately with the milieu. Pt reports good sleep during the previous night with current medication regimen.  A: Pt provided with medications per providers orders. Pt's labs and vitals were monitored throughout the night. Pt supported emotionally and encouraged to express concerns and questions. Pt educated on medications.  R: Pt's safety ensured with 15 minute and environmental checks. Pt currently denies SI, HI, and AVH. Pt verbally contracts to seek staff if SI,HI, or AVH occurs and to consult with staff before acting on any harmful thoughts. Will continue to monitor.

## 2018-02-19 NOTE — Progress Notes (Signed)
[facility] MD Progress Note  02/19/2018 11:24 AM Curtis Davenport  MRN:  [id number]   Subjective: Chart reviewed. [name], he has been out of bed and went outside for rec therapy. He states that he showered without prompting today.  He endorses seeing death this morning, but "death didn't say anything to me today." He denies SI, HI AH today. He is awake alert oriented to self and place.  He is able to state the date with prompts for date and day of the week. Patient continues to isolate when not out for medications and meals.    Patient is a poor historian. He is agreeable to living in a group home after discharge.  TB/PPD placed today.  Patient received Prolixin 25 mg IM on 02/15/2018 patient reports tolerating medications well denies any medication side effects.  Support encouragement reassurance was provided. Consider group home with assurance on medication management.   History: Per admission assessment note Curtis Davenport an 27 y.o.male, who presents involuntary and unaccompanied to [facility].Clinician asked the pt, "what brought you to the hospital?"Pt reported, I guess the police did." Clinician asked the pt why the police brought him to the hospital? Pt reported, "I don't know why." Pt denies, SI, HI, AVH, self-injurious behaviors and access to weapons. Pt was IVC'd by his mother. Per IVC paperwork: "Diagnosed paranoid schizophrenia, stated he wanted to lay on the train tracks, and threatened to kill himself. Location of respondent at this time is unknown, but mother believes he could be possibly be found at [name], [name], [place] ([facility])." Pt denies, wanting to lay on the train tracks.     Principal Problem: Schizophrenia, paranoid, chronic (HCC) Diagnosis:   Patient Active Problem List   Diagnosis Date Noted  . Schizophrenia, paranoid (HCC) [F20.0] 02/14/2018  . Schizophrenia, paranoid, chronic (HCC) [F20.0] 01/13/2018  . Psychosis (HCC) [F29]   . Paranoid schizophrenia (HCC)  [F20.0] 01/10/2018  . Schizophrenia, undifferentiated (HCC) [F20.3]    Total Time spent with patient: 35 min  Past Psychiatric History: Previous inpatient admission.  Diagnosed schizophrenia paranoia.  And psychosis  Past Medical History:  Past Medical History:  Diagnosis Date  . Hallucinations   . PTSD (post-traumatic stress disorder)   . Schizophrenia (HCC)    History reviewed. No pertinent surgical history. Family History: History reviewed. No pertinent family history. Family Psychiatric  History:  See H&P Social History:  Social History   Substance and Sexual Activity  Alcohol Use Yes     Social History   Substance and Sexual Activity  Drug Use No    Social History   Socioeconomic History  . Marital status: Single    Spouse name: Not on file  . Number of children: Not on file  . Years of education: Not on file  . Highest education level: Not on file  Occupational History  . Not on file  Social Needs  . Financial resource strain: Not on file  . Food insecurity:    Worry: Not on file    Inability: Not on file  . Transportation needs:    Medical: Not on file    Non-medical: Not on file  Tobacco Use  . Smoking status: Current Some Day Smoker    Packs/day: 0.50  . Smokeless tobacco: Never Used  Substance and Sexual Activity  . Alcohol use: Yes  . Drug use: No  . Sexual activity: Yes  Lifestyle  . Physical activity:    Days per week: Not on file  Minutes per session: Not on file  . Stress: Not on file  Relationships  . Social connections:    Talks on phone: Not on file    Gets together: Not on file    Attends religious service: Not on file    Active member of club or organization: Not on file    Attends meetings of clubs or organizations: Not on file    Relationship status: Not on file  Other Topics Concern  . Not on file  Social History Narrative  . Not on file   Additional Social History:             see H&P            Sleep:  Fair  Appetite:  Fair  Current Medications: Current Facility-Administered Medications  Medication Dose Route Frequency Provider Last Rate Last Dose  . acetaminophen (TYLENOL) tablet 650 mg  650 mg Oral Q6H PRN Laveda Abbe, NP      . alum & mag hydroxide-simeth (MAALOX/MYLANTA) 200-200-20 MG/5ML suspension 30 mL  30 mL Oral Q4H PRN Laveda Abbe, NP      . benztropine (COGENTIN) tablet 0.5 mg  0.5 mg Oral BID PRN Mariel Craft, MD      . fluPHENAZine (PROLIXIN) tablet 5 mg  5 mg Oral BH-qamhs Mariel Craft, MD   5 mg at 02/19/18 0753  . hydrocerin (EUCERIN) cream   Topical BID Oneta Rack, NP      . hydrOXYzine (ATARAX/VISTARIL) tablet 25 mg  25 mg Oral TID PRN Laveda Abbe, NP      . magnesium hydroxide (MILK OF MAGNESIA) suspension 30 mL  30 mL Oral Daily PRN Laveda Abbe, NP      . traZODone (DESYREL) tablet 100 mg  100 mg Oral QHS PRN Mariel Craft, MD   100 mg at 02/18/18 2230  . tuberculin injection 5 Units  5 Units Intradermal Once Mariel Craft, MD        Lab Results: No results found for this or any previous visit (from the past 48 hour(s)).  Blood Alcohol level:  Lab Results  Component Value Date   ETH <10 02/14/2018   ETH <10 01/10/2018    Metabolic Disorder Labs: Lab Results  Component Value Date   HGBA1C 5.6 01/15/2018   MPG 114.02 01/15/2018   Lab Results  Component Value Date   PROLACTIN 44.7 (H) 01/15/2018   Lab Results  Component Value Date   CHOL 213 (H) 01/15/2018   TRIG 221 (H) 01/15/2018   HDL 60 01/15/2018   CHOLHDL 3.6 01/15/2018   VLDL 44 (H) 01/15/2018   LDLCALC 109 (H) 01/15/2018    Physical Findings: AIMS: Facial and Oral Movements Muscles of Facial Expression: None, normal Lips and Perioral Area: None, normal Jaw: None, normal Tongue: None, normal,Extremity Movements Upper (arms, wrists, hands, fingers): None, normal Lower (legs, knees, ankles, toes): None, normal, Trunk  Movements Neck, shoulders, hips: None, normal, Overall Severity Severity of abnormal movements (highest score from questions above): None, normal Incapacitation due to abnormal movements: None, normal Patient's awareness of abnormal movements (rate only patient's report): No Awareness, Dental Status Current problems with teeth and/or dentures?: No Does patient usually wear dentures?: No  CIWA:  CIWA-Ar Total: 0 COWS:  COWS Total Score: 0  Musculoskeletal: Strength & Muscle Tone: within normal limits Gait & Station: normal Patient leans: N/A  Psychiatric Specialty Exam: Physical Exam  Vitals reviewed. Constitutional: He appears well-developed.  Neurological: He is alert.  Psychiatric: He has a normal mood and affect.    Review of Systems  Psychiatric/Behavioral: Negative for depression and suicidal ideas. The patient is not nervous/anxious and does not have insomnia.   All other systems reviewed and are negative.   Blood pressure 133/77, pulse 76, temperature 98.1 F (36.7 C), temperature source Oral, resp. rate 20, height 6' (1.829 m), weight 89.4 kg (197 lb), SpO2 100 %.Body mass index is 26.72 kg/m.  General Appearance: Casual and Guarded malodorous  Eye Contact:  Minimal  Speech:  Clear and Coherent  Volume:  Normal  Mood:  Anxious, Depressed and Dysphoric  Affect:  Flat  Thought Process:  Coherent  Orientation:  Other:  person and place  Thought Content:  Paranoid Ideation  Suicidal Thoughts:  No  Homicidal Thoughts:  No  Memory:  Immediate;   Fair Recent;   Fair Remote;   Fair  Judgement:  Fair  Insight:  Fair  Psychomotor Activity:  Normal  Concentration:  Concentration: Fair  Recall:  [employer]Fair  Fund of Knowledge:  Fair  Language:  Fair  Akathisia:  No  Handed:  Right  AIMS (if indicated):     Assets:  Communication Skills Desire for Improvement Resilience Social Support  ADL's:  Intact  Cognition:  WNL  Sleep:  Number of Hours: 5.75     Treatment Plan  Summary: Daily contact with patient to assess and evaluate symptoms and progress in treatment and Medication management   Continue with current treatment plan on 02/16/2017 as listed below except were noted  Mood stabilization:  Continue Prolixin 5mg  PO BID  Fluphenazine 25mg  IM was given 02/15/2018 Plan for Fluphenazine 50 mg IM prior to discharge.  Insomnia  continue Trazodone 100 mg p.o. Nightly  EPS:  Continue Cogentin 5 mg p.o. twice daily PRN only  Anxiety:  Continue Vistaril 25 mg p.o. 3 times daily PRN  CSW to continue working on discharge disposition Patient encouraged to attend daily group sessions with active and engaged participation throughout the milieu  Patient received Prolixin 25 mg IM on 02/15/2018 patient reports tolerating medications well denies any medication side effects.  Support encouragement reassurance was provided.  Recommend group home after discharge with assurance on medication management.  TB/PPD placed today   Mariel CraftSHEILA M MAURER, MD 02/19/2018, 11:24 AM

## 2018-02-19 NOTE — [place] FL2 (Signed)
[place] MEDICAID FL2 LEVEL OF CARE SCREENING TOOL     IDENTIFICATION  Patient Name: Curtis Davenport Birthdate: 1990/10/30 Sex: male Admission Date (Current Location): 02/14/2018  [place]  [place] [id number] S Facility and Address:  [facility] [address] [name], [place] [address]      Provider Number: [id number]  Attending Physician Name and Address:  Mariel CraftMaurer, Sheila M, MD  Relative Name and Phone Number:  Aura Dialsonya Dishon (pt's mother and legal guardian) [phone]    Current Level of Care: Hospital [facility] Prior Approval Number: RSVP Referral ID: [id number]  Date Approved/Denied:   PASRR Number: n/a   Discharge Plan: Other (Comment)(Group Home/Family Care Home )    Current Diagnoses: Patient Active Problem List   Diagnosis Date Noted  . Schizophrenia, paranoid (HCC) 02/14/2018  . Schizophrenia, paranoid, chronic (HCC) 01/13/2018  . Psychosis (HCC)   . Paranoid schizophrenia (HCC) 01/10/2018  . Schizophrenia, undifferentiated (HCC)     Orientation RESPIRATION BLADDER Height & Weight     Self, Time, Situation, Place    Continent Weight: 197 lb (89.4 kg) Height:  6' (182.9 cm)  BEHAVIORAL SYMPTOMS/MOOD NEUROLOGICAL BOWEL NUTRITION STATUS      Continent    AMBULATORY STATUS COMMUNICATION OF NEEDS Skin   Independent   Normal(eczema--some scabbing from this)                       Personal Care Assistance Level of Assistance  (requires prompting, but able to complete ADLs)  minimal         Functional Limitations Info   none identified           SPECIAL CARE FACTORS FREQUENCY                       Contractures Contractures Info: Not present    Additional Factors Info  Allergies   Allergies Info: shellfish allergy           Current Medications (02/19/2018):  This is the current hospital active medication list Current Facility-Administered Medications   Medication Dose Route Frequency Provider Last Rate Last Dose  . acetaminophen (TYLENOL) tablet 650 mg  650 mg Oral Q6H PRN Laveda AbbeParks, Laurie Britton, NP      . alum & mag hydroxide-simeth (MAALOX/MYLANTA) 200-200-20 MG/5ML suspension 30 mL  30 mL Oral Q4H PRN Laveda AbbeParks, Laurie Britton, NP      . benztropine (COGENTIN) tablet 0.5 mg  0.5 mg Oral BID PRN Mariel CraftMaurer, Sheila M, MD      . fluPHENAZine (PROLIXIN) tablet 5 mg  5 mg Oral BH-qamhs Mariel CraftMaurer, Sheila M, MD   5 mg at 02/19/18 0753  . hydrocerin (EUCERIN) cream   Topical BID Oneta RackLewis, Tanika N, NP      . hydrOXYzine (ATARAX/VISTARIL) tablet 25 mg  25 mg Oral TID PRN Laveda AbbeParks, Laurie Britton, NP      . magnesium hydroxide (MILK OF MAGNESIA) suspension 30 mL  30 mL Oral Daily PRN Laveda AbbeParks, Laurie Britton, NP      . traZODone (DESYREL) tablet 100 mg  100 mg Oral QHS PRN Mariel CraftMaurer, Sheila M, MD   100 mg at 02/18/18 2230  . tuberculin injection 5 Units  5 Units Intradermal Once Mariel CraftMaurer, Sheila M, MD   5 Units at 02/19/18 1215     Discharge Medications: Please see discharge summary for a list of discharge medications.  Relevant Imaging Results:  Relevant Lab Results:  Additional Information none   Rona Ravens, LCSW 02/19/2018 1:40 PM

## 2018-02-19 NOTE — Progress Notes (Signed)
RSVP Referral ID: [id number]. FL2 completed. Pt and his legal guardian Aura Dialsonya Deeney (mother) [phone] are agreeable to group home/family care home placement. [place] for [place].  Zayed Griffie S. Alan RipperHolloway, MSW, LCSW Clinical Social Worker 02/19/2018 1:47 PM

## 2018-02-19 NOTE — Progress Notes (Signed)
Recreation Therapy Notes  Date: 7.31.19 Time: 1000 Location: 500 Hall Dayroom  Group Topic: Anxiety  Goal Area(s) Addresses:  Patient will be able to identify triggers to anxiety.  Patient will be able to identify physical symptoms of anxiety. Patient will identify positive coping skills to use for anxiety post d/c.  Intervention: Worksheet, pencils  Activity: Intro. To Anxiety.  Patients were to identify triggers, physical symptoms, thoughts and coping skills that pertain to anxiety.  Education: Communication, Discharge Planning  Education Outcome: Acknowledges understanding/In group clarification offered/Needs additional education.   Clinical Observations/Feedback: Pt did not attend group.     Caroll RancherMarjette Emersynn Deatley, LRT/CTRS         Lillia AbedLindsay, Juanna Pudlo A 02/19/2018 11:20 AM

## 2018-02-20 NOTE — Progress Notes (Signed)
[facility] MD Progress Note  02/20/2018 6:09 PM Shail Urbas  MRN:  [id number]   Subjective: Chart reviewed. [place] seen, he has been out of bed and went outside for rec therapy and meals. He denies SI, HI AVH today. He is awake alert oriented to self and place.  He is able to state the date with prompts for date and day of the week. Patient continues to isolate when not out for medications and meals.  Patient is a poor historian, but pleasant. Smiles inappropriately and responding to internal stimuli.  He is agreeable to living in a group home after discharge.  TB/PPD placed 02/19/2018.  Patient received Prolixin 25 mg IM on 02/15/2018 patient reports tolerating medications well denies any medication side effects.  Support encouragement reassurance was provided. Consider group home with assurance on medication management.   History: Per admission assessment note Aziz Toureis an 27 y.o.male, who presents involuntary and unaccompanied to [facility].Clinician asked the pt, "what brought you to the hospital?"Pt reported, I guess the police did." Clinician asked the pt why the police brought him to the hospital? Pt reported, "I don't know why." Pt denies, SI, HI, AVH, self-injurious behaviors and access to weapons. Pt was IVC'd by his mother. Per IVC paperwork: "Diagnosed paranoid schizophrenia, stated he wanted to lay on the train tracks, and threatened to kill himself. Location of respondent at this time is unknown, but mother believes he could be possibly be found at [address], [place], [place] ([facility])." Pt denies, wanting to lay on the train tracks.     Principal Problem: Schizophrenia, paranoid, chronic (HCC) Diagnosis:   Patient Active Problem List   Diagnosis Date Noted  . Schizophrenia, paranoid (HCC) [F20.0] 02/14/2018  . Schizophrenia, paranoid, chronic (HCC) [F20.0] 01/13/2018  . Psychosis (HCC) [F29]   . Paranoid schizophrenia (HCC) [F20.0] 01/10/2018  . Schizophrenia, undifferentiated  (HCC) [F20.3]    Total Time spent with patient: 35 min  Past Psychiatric History: Previous inpatient admission.  Diagnosed schizophrenia paranoia.  And psychosis  Past Medical History:  Past Medical History:  Diagnosis Date  . Hallucinations   . PTSD (post-traumatic stress disorder)   . Schizophrenia (HCC)    History reviewed. No pertinent surgical history. Family History: History reviewed. No pertinent family history. Family Psychiatric  History:  See H&P Social History:  Social History   Substance and Sexual Activity  Alcohol Use Yes     Social History   Substance and Sexual Activity  Drug Use No    Social History   Socioeconomic History  . Marital status: Single    Spouse name: Not on file  . Number of children: Not on file  . Years of education: Not on file  . Highest education level: Not on file  Occupational History  . Not on file  Social Needs  . Financial resource strain: Not on file  . Food insecurity:    Worry: Not on file    Inability: Not on file  . Transportation needs:    Medical: Not on file    Non-medical: Not on file  Tobacco Use  . Smoking status: Current Some Day Smoker    Packs/day: 0.50  . Smokeless tobacco: Never Used  Substance and Sexual Activity  . Alcohol use: Yes  . Drug use: No  . Sexual activity: Yes  Lifestyle  . Physical activity:    Days per week: Not on file    Minutes per session: Not on file  . Stress: Not  on file  Relationships  . Social connections:    Talks on phone: Not on file    Gets together: Not on file    Attends religious service: Not on file    Active member of club or organization: Not on file    Attends meetings of clubs or organizations: Not on file    Relationship status: Not on file  Other Topics Concern  . Not on file  Social History Narrative  . Not on file   Additional Social History:             see H&P            Sleep: Fair  Appetite:  Fair  Current Medications: Current  Facility-Administered Medications  Medication Dose Route Frequency Provider Last Rate Last Dose  . acetaminophen (TYLENOL) tablet 650 mg  650 mg Oral Q6H PRN Laveda Abbe, NP      . alum & mag hydroxide-simeth (MAALOX/MYLANTA) 200-200-20 MG/5ML suspension 30 mL  30 mL Oral Q4H PRN Laveda Abbe, NP      . benztropine (COGENTIN) tablet 0.5 mg  0.5 mg Oral BID PRN Mariel Craft, MD      . fluPHENAZine (PROLIXIN) tablet 5 mg  5 mg Oral BH-qamhs Mariel Craft, MD   5 mg at 02/20/18 1610  . hydrocerin (EUCERIN) cream   Topical BID Oneta Rack, NP      . hydrOXYzine (ATARAX/VISTARIL) tablet 25 mg  25 mg Oral TID PRN Laveda Abbe, NP      . magnesium hydroxide (MILK OF MAGNESIA) suspension 30 mL  30 mL Oral Daily PRN Laveda Abbe, NP      . traZODone (DESYREL) tablet 100 mg  100 mg Oral QHS PRN Mariel Craft, MD   100 mg at 02/19/18 2041  . tuberculin injection 5 Units  5 Units Intradermal Once Mariel Craft, MD   5 Units at 02/19/18 1215    Lab Results: No results found for this or any previous visit (from the past 48 hour(s)).  Blood Alcohol level:  Lab Results  Component Value Date   ETH <10 02/14/2018   ETH <10 01/10/2018    Metabolic Disorder Labs: Lab Results  Component Value Date   HGBA1C 5.6 01/15/2018   MPG 114.02 01/15/2018   Lab Results  Component Value Date   PROLACTIN 44.7 (H) 01/15/2018   Lab Results  Component Value Date   CHOL 213 (H) 01/15/2018   TRIG 221 (H) 01/15/2018   HDL 60 01/15/2018   CHOLHDL 3.6 01/15/2018   VLDL 44 (H) 01/15/2018   LDLCALC 109 (H) 01/15/2018    Physical Findings: AIMS: Facial and Oral Movements Muscles of Facial Expression: None, normal Lips and Perioral Area: None, normal Jaw: None, normal Tongue: None, normal,Extremity Movements Upper (arms, wrists, hands, fingers): None, normal Lower (legs, knees, ankles, toes): None, normal, Trunk Movements Neck, shoulders, hips: None, normal,  Overall Severity Severity of abnormal movements (highest score from questions above): None, normal Incapacitation due to abnormal movements: None, normal Patient's awareness of abnormal movements (rate only patient's report): No Awareness, Dental Status Current problems with teeth and/or dentures?: No Does patient usually wear dentures?: No  CIWA:  CIWA-Ar Total: 0 COWS:  COWS Total Score: 0  Musculoskeletal: Strength & Muscle Tone: within normal limits Gait & Station: normal Patient leans: N/A  Psychiatric Specialty Exam: Physical Exam  Vitals reviewed. Constitutional: He appears well-developed.  Neurological: He is alert.  Psychiatric: He  has a normal mood and affect.    Review of Systems  Psychiatric/Behavioral: Negative for depression and suicidal ideas. The patient is not nervous/anxious and does not have insomnia.   All other systems reviewed and are negative.   Blood pressure 107/63, pulse 89, temperature 97.6 F (36.4 C), resp. rate 16, height 6' (1.829 m), weight 89.4 kg (197 lb), SpO2 100 %.Body mass index is 26.72 kg/m.  General Appearance: Casual and Guarded malodorous  Eye Contact:  Minimal  Speech:  Clear and Coherent  Volume:  Normal  Mood:  Anxious, Depressed and Dysphoric  Affect:  Flat  Thought Process:  Coherent  Orientation:  Other:  person and place  Thought Content:  Paranoid Ideation  Suicidal Thoughts:  No  Homicidal Thoughts:  No  Memory:  Immediate;   Fair Recent;   Fair Remote;   Fair  Judgement:  Fair  Insight:  Fair  Psychomotor Activity:  Normal  Concentration:  Concentration: Fair  Recall:  [employer]Fair  Fund of Knowledge:  Fair  Language:  Fair  Akathisia:  No  Handed:  Right  AIMS (if indicated):     Assets:  Communication Skills Desire for Improvement Resilience Social Support  ADL's:  Intact  Cognition:  WNL  Sleep:  Number of Hours: 6.25     Treatment Plan Summary: Daily contact with patient to assess and evaluate symptoms and  progress in treatment and Medication management   Continue with current treatment plan on 02/16/2017 as listed below except were noted  Mood stabilization:  Continue Prolixin 5mg  PO BID  Fluphenazine 25mg  IM was given 02/15/2018 Plan for Fluphenazine 50 mg IM prior to discharge.  Insomnia  continue Trazodone 100 mg p.o. Nightly  EPS:  Continue Cogentin 5 mg p.o. twice daily PRN only  Anxiety:  Continue Vistaril 25 mg p.o. 3 times daily PRN  CSW to continue working on discharge disposition Patient encouraged to attend daily group sessions with active and engaged participation throughout the milieu  Patient received Prolixin 25 mg IM on 02/15/2018 patient reports tolerating medications well denies any medication side effects.  Support encouragement reassurance was provided.  Recommend group home after discharge with assurance on medication management.  TB/PPD placed today   Mariel CraftSHEILA M Denaly Gatling, MD 02/20/2018, 6:09 PM

## 2018-02-20 NOTE — Progress Notes (Signed)
Group Home/ Family Care Home Placement    [facility]- No answer, unable to leave voicemail [facility] - No answer, left voicemail [facility] - No answer, unable to leave voicemail  [facility] - referral currently under review Curtis PoissonPatricia Davenport- referral currently under review   [facility] due to not meeting age requirement.  [facility] - [facility]ined, at capacity [facility]- Declined, at capacity     CSW will continue to follow for possible placement.   Curtis DaubJolan Kristyana Davenport, MSW, LCSWA Clinical Social Worker [facility]  Phone: [phone]

## 2018-02-20 NOTE — Progress Notes (Signed)
Recreation Therapy Notes  Date: 8.1.19 Time: 1000 Location: 500 Hall Dayroom   Group Topic: Communication, Team Building, Problem Solving  Goal Area(s) Addresses:  Patient will effectively work with peer towards shared goal.  Patient will identify skill used to make activity successful.  Patient will identify how skills used during activity can be used to reach post d/c goals.   Intervention: STEM Activity   Activity: [place]. Patients were provided the following materials: 5 drinking straws, 5 rubber bands, 5 paper clips, 2 index cards, 2 drinking cups, and 2 toilet paper rolls. Using the provided materials patients were asked to build a launching mechanisms to launch a ping pong ball approximately 12 feet. Patients were divided into teams of 3-5.   Education: Pharmacist, communityocial Skills, Building control surveyorDischarge Planning.   Education Outcome: Acknowledges education/In group clarification offered/Needs additional education.   Clinical Observations/Feedback: Pt did not attend group.     Caroll RancherMarjette Coady Train, LRT/CTRS     Caroll RancherLindsay, Jennah Satchell A 02/20/2018 11:32 AM

## 2018-02-20 NOTE — Plan of Care (Signed)
  Problem: Education: Goal: Will be free of psychotic symptoms Outcome: Progressing   Problem: Safety: Goal: Ability to remain free from injury will improve Outcome: Progressing   Problem: Self-Care: Goal: Ability to participate in self-care as condition permits will improve Outcome: Not Progressing  DAR NOTE: Patient presents with fixed smile and anxious mood.  Denies suicidal thoughts, pain, auditory and visual hallucinations.  Rates depression at 5, hopelessness at 5, and anxiety at 5.  Patient continues to need a lot of redirection on his personal hygiene. Maintained on routine safety checks.  Medications given as prescribed.  Support and encouragement offered as needed.  States goal for today is "health."  Patient remained in his room most of this shift.  Offered no complaint.

## 2018-02-21 MED ORDER — FLUPHENAZINE DECANOATE 25 MG/ML IJ SOLN
50.0000 mg | Freq: Once | INTRAMUSCULAR | Status: AC
  Administered 2018-02-22: 50 mg via INTRAMUSCULAR
  Filled 2018-02-21: qty 2

## 2018-02-21 MED ORDER — HYDROCORTISONE 1 % EX CREA
TOPICAL_CREAM | Freq: Two times a day (BID) | CUTANEOUS | Status: DC
  Administered 2018-02-21 – 2018-02-24 (×3): via TOPICAL
  Filled 2018-02-21: qty 28

## 2018-02-21 NOTE — Tx Team (Signed)
Interdisciplinary Treatment and Diagnostic Plan Update  02/21/2018 Time of Session: 0830AM Curtis Davenport MRN: [id number]  Principal Diagnosis: Schizophrenia, paranoid, chronic (HCC)  Secondary Diagnoses: Principal Problem:   Schizophrenia, paranoid, chronic (HCC) Active Problems:   Schizophrenia, paranoid (HCC)   Current Medications:  Current Facility-Administered Medications  Medication Dose Route Frequency Provider Last Rate Last Dose  . acetaminophen (TYLENOL) tablet 650 mg  650 mg Oral Q6H PRN Laveda Abbe, NP      . alum & mag hydroxide-simeth (MAALOX/MYLANTA) 200-200-20 MG/5ML suspension 30 mL  30 mL Oral Q4H PRN Laveda Abbe, NP      . benztropine (COGENTIN) tablet 0.5 mg  0.5 mg Oral BID PRN Mariel Craft, MD      . fluPHENAZine (PROLIXIN) tablet 5 mg  5 mg Oral BH-qamhs Mariel Craft, MD   5 mg at 02/21/18 0800  . hydrocerin (EUCERIN) cream   Topical BID Oneta Rack, NP      . hydrOXYzine (ATARAX/VISTARIL) tablet 25 mg  25 mg Oral TID PRN Laveda Abbe, NP      . magnesium hydroxide (MILK OF MAGNESIA) suspension 30 mL  30 mL Oral Daily PRN Laveda Abbe, NP      . traZODone (DESYREL) tablet 100 mg  100 mg Oral QHS PRN Mariel Craft, MD   100 mg at 02/20/18 2121  . tuberculin injection 5 Units  5 Units Intradermal Once Mariel Craft, MD   5 Units at 02/19/18 1215   PTA Medications: Medications Prior to Admission  Medication Sig Dispense Refill Last Dose  . albuterol (PROVENTIL HFA;VENTOLIN HFA) 108 (90 Base) MCG/ACT inhaler Inhale 2 puffs into the lungs every 6 (six) hours as needed for shortness of breath. (Patient not taking: Reported on 02/14/2018)   Not Taking at Unknown time  . benztropine (COGENTIN) 0.5 MG tablet Take 1 tablet (0.5 mg total) by mouth 2 (two) times daily. For prevention of drug induced tremors 60 tablet 0 Past Week at Unknown time  . cetirizine (ZYRTEC) 5 MG tablet Take 5 mg by mouth daily.  3 Past Week at  Unknown time  . fluPHENAZine (PROLIXIN) 5 MG tablet Take 1 tablet (5 mg total) by mouth 2 (two) times daily. For mood control 60 tablet 0 Past Week at Unknown time  . fluticasone (FLONASE) 50 MCG/ACT nasal spray Place 2 sprays into both nostrils daily.  2 Past Week at Unknown time  . hydrOXYzine (ATARAX/VISTARIL) 50 MG tablet Take 1 tablet (50 mg total) by mouth every 6 (six) hours as needed for anxiety. 60 tablet 0 Past Week at Unknown time  . lidocaine (LIDODERM) 5 % Place 1 patch onto the skin daily. Remove & Discard patch within 12 hours or as directed by MD: For pain 30 patch 0 Past Week at Unknown time  . traZODone (DESYREL) 100 MG tablet Take 1 tablet (100 mg total) by mouth at bedtime as needed for sleep. 30 tablet 0 Past Week at Unknown time    Patient Stressors: Financial difficulties Medication change or noncompliance Occupational concerns  Patient Strengths: Capable of independent living Motivation for treatment/growth Physical Health  Treatment Modalities: Medication Management, Group therapy, Case management,  1 to 1 session with clinician, Psychoeducation, Recreational therapy.   Physician Treatment Plan for Primary Diagnosis: Schizophrenia, paranoid, chronic (HCC) Long Term Goal(s): Improvement in symptoms so as ready for discharge Improvement in symptoms so as ready for discharge   Short Term Goals: Ability to identify and develop effective  coping behaviors will improve Ability to identify changes in lifestyle to reduce recurrence of condition will improve  Medication Management: Evaluate patient's response, side effects, and tolerance of medication regimen.  Therapeutic Interventions: 1 to 1 sessions, Unit Group sessions and Medication administration.  Evaluation of Outcomes: Progressing  Physician Treatment Plan for Secondary Diagnosis: Principal Problem:   Schizophrenia, paranoid, chronic (HCC) Active Problems:   Schizophrenia, paranoid (HCC)  Long Term  Goal(s): Improvement in symptoms so as ready for discharge Improvement in symptoms so as ready for discharge   Short Term Goals: Ability to identify and develop effective coping behaviors will improve Ability to identify changes in lifestyle to reduce recurrence of condition will improve     Medication Management: Evaluate patient's response, side effects, and tolerance of medication regimen.  Therapeutic Interventions: 1 to 1 sessions, Unit Group sessions and Medication administration.  Evaluation of Outcomes: Progressing  RN Treatment Plan for Primary Diagnosis: Schizophrenia, paranoid, chronic (HCC) Long Term Goal(s): Knowledge of disease and therapeutic regimen to maintain health will improve  Short Term Goals: Ability to remain free from injury will improve, Ability to verbalize feelings will improve, Ability to disclose and discuss suicidal ideas and Ability to identify and develop effective coping behaviors will improve  Medication Management: RN will administer medications as ordered by provider, will assess and evaluate patient's response and provide education to patient for prescribed medication. RN will report any adverse and/or side effects to prescribing provider.  Therapeutic Interventions: 1 on 1 counseling sessions, Psychoeducation, Medication administration, Evaluate responses to treatment, Monitor vital signs and CBGs as ordered, Perform/monitor CIWA, COWS, AIMS and Fall Risk screenings as ordered, Perform wound care treatments as ordered.  Evaluation of Outcomes:  Progressing   LCSW Treatment Plan for Primary Diagnosis: Schizophrenia, paranoid, chronic (HCC) Long Term Goal(s): Safe transition to appropriate next level of care at discharge, Engage patient in therapeutic group addressing interpersonal concerns.  Short Term Goals: Engage patient in aftercare planning with referrals and resources, Increase ability to appropriately verbalize feelings and Increase skills for  wellness and recovery  Therapeutic Interventions: Assess for all discharge needs, 1 to 1 time with Social worker, Explore available resources and support systems, Assess for adequacy in community support network, Educate family and significant other(s) on suicide prevention, Complete Psychosocial Assessment, Interpersonal group therapy.  Evaluation of Outcomes: Progressing   Progress in Treatment: Attending groups: Yes Participating in groups: Yes.  Taking medication as prescribed: Yes. Toleration medication: Yes. Family/Significant other contact made:SPE completed with pt's mother.  Patient understands diagnosis: Yes. Discussing patient identified problems/goals with staff: Yes. Medical problems stabilized or resolved: Yes. Denies suicidal/homicidal ideation: Yes Issues/concerns per patient self-inventory: No. Other: n/a  New problem(s) identified: No, Describe:  n/a  New Short Term/Long Term Goal(s): elimination of AVH,  medication management for mood stabilization; elimination of SI thoughts; development of comprehensive mental wellness/sobriety plan.   Patient Goals:  Help with anxiety and not sleeping.   Discharge Plan or Barriers: Pt agreeable to group home/Family Care home placement. CSW exploring multiple option for placement. Pt is able to return home with his mother at discharge as well--[facility] appt made. Pt's mother is his legal guardian. [facility] pamphlet, Mobile Crisis information provided to patient for additional community support and resources.   Reason for Continuation of Hospitalization: Anxiety Depression Hallucinations Medication stabilization  Estimated Length of Stay: Monday, 02/24/18  Attendees: Patient: 02/21/2018 9:56 AM  Physician: Dr. Viviano SimasMaurer MD 02/21/2018 9:56 AM  Nursing: Meriam SpragueBeverly RN; Lanora ManisElizabeth RN 02/21/2018 9:56 AM  RN Care Manager:x 02/21/2018 9:56 AM  Social Worker: Corrie Mckusick LCSW 02/21/2018 9:56 AM  Recreational Therapist: x 02/21/2018 9:56 AM  Other:  Armandina Stammer NP; Gilda Crease NP 02/21/2018 9:56 AM  Other:  02/21/2018 9:56 AM  Other: 02/21/2018 9:56 AM    Scribe for Treatment Team: Rona Ravens, LCSW 02/21/2018 9:56 AM

## 2018-02-21 NOTE — Progress Notes (Signed)
Dar Note: Patient presents with fixed smile and pleasant mood.  Denies suicidal thoughts, auditory and visual hallucinations.  Medications given as prescribed.  Patient visible in milieu with minimal interactions with staff and peers.  PPD result negative with 0 mm induration.  No redness or swelling noted.  MD made aware of result.  States goal for today is to "rest and have healthy thoughts."  Patient is safe on and off the unit.
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
## 2018-02-21 NOTE — BHH Counselor (Signed)
CSW called [facility] at [phone] to schedule hospital discharge appointment. Per Ms. Johnson/Triage RN, she stated they are unable to see patient due to the provider on patient's Medicaid card is not [facility] and is not from [facility]. Ms. Laural BenesJohnson advised that hospital follow-up appointment be scheduled with patient's PCP.  CSW called patient's mother regarding issue with Medicaid, and asked if she is agreeable for patient to be [facility]. Mother was receptive and agreeable. CSW advised mother to be sure to have patient's Medicaid changed to [facility] once patient is admitted into the group home.  [facility] to schedule hospital follow-up appointment. Message left to please return call to Corrie MckusickHeather Holloway at [phone].   Roselyn Beringegina Tenesha Garza, MSW, LCSW Clinical Social Work

## 2018-02-21 NOTE — Progress Notes (Signed)
Recreation Therapy Notes  Date: 8.2.19 Time: 1000 Location: 500 Hall  Group Topic: Communication, Team building  Goal Area(s) Addresses:  Patient will effectively communicate with peers in group.  Patient will work together to complete task. Patient will verbalize benefits of positive communication and teamwork post d/c.  Intervention: Veterinary surgeonubber discs  Activity: [place].  Each person was given a rubber disc and one disc was given to the group.  Patients had to use the discs to move from one end of the hall to the other and then comeback to the starting point.  Patients were to stand on discs the entire time.  If anyone stepped of their disc, the group had to start over.  Education: Communication, Discharge Planning  Education Outcome: Acknowledges understanding/In group clarification offered/Needs additional education.   Clinical Observations/Feedback: Pt did not attend group.     Caroll RancherMarjette Mauriah Mcmillen, LRT/CTRS         Caroll RancherLindsay, Crystal Ellwood A 02/21/2018 11:24 AM

## 2018-02-21 NOTE — Progress Notes (Addendum)
Pt referred to [facility]. FL2 faxed to [phone]. Contact person: Ethelene Brownsnthony (Cell) [phone]/main number: [phone]. Pt in review.   Ponciano Shealy S. Alan RipperHolloway, MSW, LCSW Clinical Social Worker 02/21/2018 11:49 AM   CSW left message with Ethelene Brownsnthony to call back regarding referral at his earliest convenience.  Cardin Nitschke S. Alan RipperHolloway, MSW, LCSW Clinical Social Worker 02/21/2018 2:27 PM

## 2018-02-21 NOTE — Progress Notes (Signed)
Pt has been accepted to [facility] for Monday, 8/5 per Ethelene BrownsAnthony. He asked that CSW contact him Monday morning to confirm transportation time.   Lamarius Dirr S. Alan RipperHolloway, MSW, LCSW Clinical Social Worker 02/21/2018 3:08 PM

## 2018-02-21 NOTE — Plan of Care (Signed)
D: Pt denies SI/HI/AVH. Pt is pleasant and cooperative. Pt continues to have constant smiling like he is responding to internal stimuli. Pt only concern is " when am I getting out of here"  A: Pt was offered support and encouragement. Pt was given scheduled medications. Pt was encourage to attend groups. Q 15 minute checks were done for safety.   R:  safety maintained on unit.  Problem: Education: Goal: Emotional status will improve Outcome: Progressing   Problem: Education: Goal: Mental status will improve Outcome: Progressing   Problem: Safety: Goal: Ability to redirect hostility and anger into socially appropriate behaviors will improve Outcome: Progressing

## 2018-02-22 NOTE — Plan of Care (Signed)
D: Patient presents lethargic, calm. He reports "I feel calm" and "I am sleepy." He reports he did not sleep well and had trouble staying asleep. Patient still has poor hygiene. He is isolative to his room and did not come out for meals. Patient received haldol decanoate, LAI today. Patient denies SI/HI/AVH.  A: Patient checked q15 min, and checks reviewed. Reviewed medication changes with patient and educated on side effects. Educated patient on importance of attending group therapy sessions and educated on several coping skills. Encouarged participation in milieu through recreation therapy and attending meals with peers. Support and encouragement provided. Fluids offered. R: Patient receptive to education on medications, and is medication compliant. Patient contracts for safety on the unit. Patient refused to fill out self inventory.

## 2018-02-22 NOTE — B[facility] Group Notes (Signed)
[facility] Group Notes:  (Nursing)  Date:  02/22/2018  Time:  1:30 PM Type of Therapy:  Nurse Education  Participation Level:  Did Not Attend   Curtis NevinValerie S Molli Gethers 02/22/2018, 4:04 PM

## 2018-02-22 NOTE — Progress Notes (Signed)
[facility] MD Progress Note  02/22/2018 2:08 PM Curtis Davenport  MRN:  [id number]   Subjective: Curtis Davenport reports, "I'm feeling good. I'm taking my medicines. I'm no depressed or nothing. I do attend groups. The police brought me here. They did not tell me why. I feel good".  TB/PPD placed 02/19/2018; read on 02/22/2108- negative.  Patient has been accepted to [facility] in [place], [place].  Patient received Prolixin 25 mg IM on 02/15/2018 patient reports tolerating medications well denies any medication side effects.  Support encouragement reassurance was provided.  Curtis Davenport an 27 y.o.male, who presents involuntary and unaccompanied to [facility].Clinician asked the pt, "what brought you to the hospital?"Pt reported, I guess the police did." Clinician asked the pt why the police brought him to the hospital? Pt reported, "I don't know why." Pt denies, SI, HI, AVH, self-injurious behaviors and access to weapons. Pt was IVC'd by his mother. Per IVC paperwork: "Diagnosed paranoid schizophrenia, stated he wanted to lay on the train tracks, and threatened to kill himself. Location of respondent at this time is unknown, but mother believes he could be possibly be found at [address], [place], [place] ([facility])." Pt denies, wanting to lay on the train tracks.   Curtis Davenport is seen. Chart reviewed. The chart finding discussed with the treatment team. He is lying down in his bed in his room. He is currently on a do not admit due to personal hygiene problems. Patient presents with a gross body odor. He says he does attend group sessions. However, chart review per staff reports indicated that patient is isolative in his room & apparently did come out for meals. He taking & tolerating his treatment regimen. He denies any SIHI, AVH, delusional thoughts or paranoia. No disruptive behavior reported by staff. Patient has been accepted to [facility] in [place], [place].  Principal  Problem: Schizophrenia, paranoid, chronic (HCC) Diagnosis:   Patient Active Problem List   Diagnosis Date Noted  . Schizophrenia, paranoid (HCC) [F20.0] 02/14/2018  . Schizophrenia, paranoid, chronic (HCC) [F20.0] 01/13/2018  . Psychosis (HCC) [F29]   . Paranoid schizophrenia (HCC) [F20.0] 01/10/2018  . Schizophrenia, undifferentiated (HCC) [F20.3]    Total Time spent with patient: 15 minutes  Past Psychiatric History: Previous inpatient admission.  Diagnosed schizophrenia paranoia.  And psychosis  Past Medical History:  Past Medical History:  Diagnosis Date  . Hallucinations   . PTSD (post-traumatic stress disorder)   . Schizophrenia (HCC)    History reviewed. No pertinent surgical history.  Family History: History reviewed. No pertinent family history.  Family Psychiatric  History: See H&P  Social History:  Social History   Substance and Sexual Activity  Alcohol Use Yes     Social History   Substance and Sexual Activity  Drug Use No    Social History   Socioeconomic History  . Marital status: Single    Spouse name: Not on file  . Number of children: Not on file  . Years of education: Not on file  . Highest education level: Not on file  Occupational History  . Not on file  Social Needs  . Financial resource strain: Not on file  . Food insecurity:    Worry: Not on file    Inability: Not on file  . Transportation needs:    Medical: Not on file    Non-medical: Not on file  Tobacco Use  . Smoking status: Current Some Day Smoker    Packs/day:  0.50  . Smokeless tobacco: Never Used  Substance and Sexual Activity  . Alcohol use: Yes  . Drug use: No  . Sexual activity: Yes  Lifestyle  . Physical activity:    Days per week: Not on file    Minutes per session: Not on file  . Stress: Not on file  Relationships  . Social connections:    Talks on phone: Not on file    Gets together: Not on file    Attends religious service: Not on file    Active member of  club or organization: Not on file    Attends meetings of clubs or organizations: Not on file    Relationship status: Not on file  Other Topics Concern  . Not on file  Social History Narrative  . Not on file   Additional Social History:   Sleep: Fair  Appetite:  Fair  Current Medications: Current Facility-Administered Medications  Medication Dose Route Frequency Provider Last Rate Last Dose  . acetaminophen (TYLENOL) tablet 650 mg  650 mg Oral Q6H PRN Laveda AbbeParks, Laurie Britton, NP      . alum & mag hydroxide-simeth (MAALOX/MYLANTA) 200-200-20 MG/5ML suspension 30 mL  30 mL Oral Q4H PRN Laveda AbbeParks, Laurie Britton, NP      . benztropine (COGENTIN) tablet 0.5 mg  0.5 mg Oral BID PRN Mariel CraftMaurer, Sheila M, MD      . fluPHENAZine (PROLIXIN) tablet 5 mg  5 mg Oral BH-qamhs Mariel CraftMaurer, Sheila M, MD   5 mg at 02/22/18 0751  . hydrocerin (EUCERIN) cream   Topical BID Oneta RackLewis, Tanika N, NP   Stopped at 02/22/18 [phone]  . hydrocortisone cream 1 %   Topical BID Mariel CraftMaurer, Sheila M, MD   Stopped at 02/22/18 [phone]  . hydrOXYzine (ATARAX/VISTARIL) tablet 25 mg  25 mg Oral TID PRN Laveda AbbeParks, Laurie Britton, NP      . magnesium hydroxide (MILK OF MAGNESIA) suspension 30 mL  30 mL Oral Daily PRN Laveda AbbeParks, Laurie Britton, NP      . traZODone (DESYREL) tablet 100 mg  100 mg Oral QHS PRN Mariel CraftMaurer, Sheila M, MD   100 mg at 02/20/18 2121   Lab Results: No results found for this or any previous visit (from the past 48 hour(s)).  Blood Alcohol level:  Lab Results  Component Value Date   ETH <10 02/14/2018   ETH <10 01/10/2018   Metabolic Disorder Labs: Lab Results  Component Value Date   HGBA1C 5.6 01/15/2018   MPG 114.02 01/15/2018   Lab Results  Component Value Date   PROLACTIN 44.7 (H) 01/15/2018   Lab Results  Component Value Date   CHOL 213 (H) 01/15/2018   TRIG 221 (H) 01/15/2018   HDL 60 01/15/2018   CHOLHDL 3.6 01/15/2018   VLDL 44 (H) 01/15/2018   LDLCALC 109 (H) 01/15/2018    Physical Findings: AIMS: Facial and  Oral Movements Muscles of Facial Expression: None, normal Lips and Perioral Area: None, normal Jaw: None, normal Tongue: None, normal,Extremity Movements Upper (arms, wrists, hands, fingers): None, normal Lower (legs, knees, ankles, toes): None, normal, Trunk Movements Neck, shoulders, hips: None, normal, Overall Severity Severity of abnormal movements (highest score from questions above): None, normal Incapacitation due to abnormal movements: None, normal Patient's awareness of abnormal movements (rate only patient's report): No Awareness, Dental Status Current problems with teeth and/or dentures?: No Does patient usually wear dentures?: No  CIWA:  CIWA-Ar Total: 0 COWS:  COWS Total Score: 0  Musculoskeletal: Strength & Muscle Tone: within  normal limits Gait & Station: normal Patient leans: N/A  Psychiatric Specialty Exam: Physical Exam  Nursing note and vitals reviewed. Constitutional: He appears well-developed.  Neurological: He is alert.  Psychiatric: He has a normal mood and affect.    Review of Systems  Psychiatric/Behavioral: Negative for depression and suicidal ideas. The patient is not nervous/anxious and does not have insomnia.   All other systems reviewed and are negative.   Blood pressure 120/79, pulse 88, temperature 97.9 F (36.6 C), temperature source Oral, resp. rate 17, height 6' (1.829 m), weight 89.4 kg (197 lb), SpO2 100 %.Body mass index is 26.72 kg/m.  General Appearance: Casual and Guarded malodorous  Eye Contact:  Fair  Speech:  Clear and Coherent  Volume:  Normal  Mood:  Euthymic  Affect:  Flat  Thought Process:  Coherent  Orientation:  Other:  person and place  Thought Content:  Hallucinations: denies, however appears to be responding to internal stimuli and Paranoid Ideation  Suicidal Thoughts:  No  Homicidal Thoughts:  No  Memory:  Immediate;   Fair Recent;   Fair Remote;   Fair  Judgement:  Fair  Insight:  Fair  Psychomotor Activity:   Normal  Concentration:  Concentration: Fair  Recall:  [employer] of Knowledge:  Fair  Language:  Fair  Akathisia:  No  Handed:  Right  AIMS (if indicated):     Assets:  Communication Skills Desire for Improvement Resilience Social Support  ADL's:  Intact  Cognition:  WNL  Sleep:  Number of Hours: 6.5   Treatment Plan Summary: Daily contact with patient to assess and evaluate symptoms and progress in treatment and Medication management   - Continue inpatient hospitalization.  - Will continue today 02/22/2018 plan as below except where it is noted.  Mood control:     - Continue fluphenazine (Prolixin) 5 mg po for 1 week after second injection.     - Continue Fluphenazine (Prolixin) injectable 25 mg/ml IM was given 02/15/2018.      - Continue Fluphenazine 50 mg IM to be given on 02/22/2018 &  50 mg IM every 28 days from 02-22-18.   Insomnia.     - Continue Trazodone 100 mg po Q hs.  EPS:      - Continue Cogentin 5 mg po bid prn.  Anxiety:     - Continue Vistaril 25 mg po tid prn.  CSW to continue working on discharge disposition Patient encouraged to attend daily group sessions with active and engaged participation throughout the milieu  TB/PPD placed 02/19/2018; read on 02/22/2108- negative.  Patient has been accepted to [facility] in [place], [place].  Armandina Stammer, NP, PMHNP, FNP-BC. 02/22/2018, 2:08 PMPatient ID: Erenest Rasher, male   DOB: 04-02-1991, 27 y.o.   MRN: [id number]

## 2018-02-22 NOTE — [facility] Group Notes (Signed)
[facility] Group Notes: (Clinical Social Work)   02/22/2018      Type of Therapy:  Group Therapy   Participation Level:  Did Not Attend despite MHT prompting   Lanard Arguijo N Krisna Omar, LCSW  02/22/2018 1:11 PM   

## 2018-02-22 NOTE — Progress Notes (Signed)
Patient did not attend the evening group discussion.

## 2018-02-23 NOTE — Plan of Care (Signed)
D: Patient presents depressed, isolative. He has poor hygiene and does not appear to be using soap when washing. He takes naps during the day, and does not participate in the milieu. He reports fair sleep, and did not receive medication to help with sleep. He had 6.25 hr recorded for sleep last night. His appetite is fair, energy normal and concentration good. His depression is 5/10, sense of hopelessness 0/10 and anxiety 5/10. He denies withdrawal symptoms or physical complaints. Patient denies SI/HI/AVH.  A: RN provided education on hygiene. Patient checked q15 min, and checks reviewed. Reviewed medication changes with patient and educated on side effects. Educated patient on importance of attending group therapy sessions and educated on several coping skills. Encouarged participation in milieu through recreation therapy and attending meals with peers. Support and encouragement provided. Fluids offered. R: Patient receptive to education on medications, and is medication compliant. He does delay taking his meds till later in the morning, however. Patient contracts for safety on the unit. His goal today is to "stay healthy" and "take my medicines."

## 2018-02-23 NOTE — [facility] Group Notes (Signed)
[facility] Group Notes:  (Nursing/MHT/Case Management/Adjunct)  Date:  02/23/2018  Time:  2:23 PM  Type of Therapy:  Nurse Education  Participation Level:  Did Not Attend  Summary of Progress/Problems: This group consisted of playing a card game, called "ungame." In this game, patients pick a card and read the question listed on it. They then give an example or tell a story answering the question. It is designed as a Environmental health practitionerconversation starter. The patient did not attend group therapy session.  Kirstie MirzaJonathan C Didier Brandenburg 02/23/2018, 2:23 PM

## 2018-02-23 NOTE — Progress Notes (Signed)
[facility] MD Progress Note  02/23/2018 2:39 PM Curtis Davenport  MRN:  [id number]   Subjective: Curtis Davenport reports, "I'm alright. My mood is good. I have no anxiety. I'm sleeping well".   TB/PPD placed 02/19/2018; read on 02/22/2108- negative.  Curtis has been accepted to [facility] in [place], [place].  Curtis received Prolixin 25 mg IM on 02/15/2018 Curtis reports tolerating medications well denies any medication side effects.  Support encouragement reassurance was provided.  Curtis Davenport an 27 y.o.male, who presents involuntary and unaccompanied to [facility].Clinician asked the pt, "what brought you to the hospital?"Pt reported, I guess the police did." Clinician asked the pt why the police brought him to the hospital? Pt reported, "I don't know why." Pt denies, SI, HI, AVH, self-injurious behaviors and access to weapons. Pt was IVC'd by his mother. Per IVC paperwork: "Diagnosed paranoid schizophrenia, stated he wanted to lay on the train tracks, and threatened to kill himself. Location of respondent at this time is unknown, but mother believes he could be possibly be found at [address], [place], [place] ([facility])." Pt denies, wanting to lay on the train tracks.   Curtis Davenport is seen. Chart reviewed. The chart finding discussed with the treatment team. He is lying down in his bed in his room. His do not admit order due to personal hygiene problems has been lifted. Reports indicated that Curtis took a bath last night.  Presents with a faint body odor today. He says he does attend group sessions sometimes, not all the time. However, chart review per staff reports indicated that Curtis remains isolative in his room & apparently he says he is taking & tolerating his treatment regimen. However, a house keeper while cleaning Curtis's room this morning picked up a pill off the floor. The nurse says the pills appears to be prolixin. He denies any SIHI, AVH, delusional thoughts or paranoia. No  disruptive behavior reported by staff. Curtis has been accepted to [facility] in [place], [place]. The staff are instructed to assure that Curtis is given enough water to swallow his medication & assure that he is not cheeking his medications.  Principal Problem: Schizophrenia, paranoid, chronic (HCC) Diagnosis:   Curtis Active Problem List   Diagnosis Date Noted  . Schizophrenia, paranoid (HCC) [F20.0] 02/14/2018  . Schizophrenia, paranoid, chronic (HCC) [F20.0] 01/13/2018  . Psychosis (HCC) [F29]   . Paranoid schizophrenia (HCC) [F20.0] 01/10/2018  . Schizophrenia, undifferentiated (HCC) [F20.3]    Total Time spent with Curtis: 15 minutes  Past Psychiatric History: Previous inpatient admission.  Diagnosed schizophrenia paranoia.  And psychosis  Past Medical History:  Past Medical History:  Diagnosis Date  . Hallucinations   . PTSD (post-traumatic stress disorder)   . Schizophrenia (HCC)    History reviewed. No pertinent surgical history.  Family History: History reviewed. No pertinent family history.  Family Psychiatric  History: See H&P  Social History:  Social History   Substance and Sexual Activity  Alcohol Use Yes     Social History   Substance and Sexual Activity  Drug Use No    Social History   Socioeconomic History  . Marital status: Single    Spouse name: Not on file  . Number of children: Not on file  . Years of education: Not on file  . Highest education level: Not on file  Occupational History  . Not on file  Social Needs  . Financial resource strain: Not on file  . Food  insecurity:    Worry: Not on file    Inability: Not on file  . Transportation needs:    Medical: Not on file    Non-medical: Not on file  Tobacco Use  . Smoking status: Current Some Day Smoker    Packs/day: 0.50  . Smokeless tobacco: Never Used  Substance and Sexual Activity  . Alcohol use: Yes  . Drug use: No  . Sexual activity: Yes  Lifestyle   . Physical activity:    Days per week: Not on file    Minutes per session: Not on file  . Stress: Not on file  Relationships  . Social connections:    Talks on phone: Not on file    Gets together: Not on file    Attends religious service: Not on file    Active member of club or organization: Not on file    Attends meetings of clubs or organizations: Not on file    Relationship status: Not on file  Other Topics Concern  . Not on file  Social History Narrative  . Not on file   Additional Social History:   Sleep: Good  Appetite:  Good  Current Medications: Current Facility-Administered Medications  Medication Dose Route Frequency Provider Last Rate Last Dose  . acetaminophen (TYLENOL) tablet 650 mg  650 mg Oral Q6H PRN Laveda Abbe, NP      . alum & mag hydroxide-simeth (MAALOX/MYLANTA) 200-200-20 MG/5ML suspension 30 mL  30 mL Oral Q4H PRN Laveda Abbe, NP      . benztropine (COGENTIN) tablet 0.5 mg  0.5 mg Oral BID PRN Mariel Craft, MD      . fluPHENAZine (PROLIXIN) tablet 5 mg  5 mg Oral BH-qamhs Mariel Craft, MD   5 mg at 02/23/18 1008  . hydrocerin (EUCERIN) cream   Topical BID Oneta Rack, NP      . hydrocortisone cream 1 %   Topical BID Mariel Craft, MD      . hydrOXYzine (ATARAX/VISTARIL) tablet 25 mg  25 mg Oral TID PRN Laveda Abbe, NP      . magnesium hydroxide (MILK OF MAGNESIA) suspension 30 mL  30 mL Oral Daily PRN Laveda Abbe, NP      . traZODone (DESYREL) tablet 100 mg  100 mg Oral QHS PRN Mariel Craft, MD   100 mg at 02/22/18 2153   Lab Results: No results found for this or any previous visit (from the past 48 hour(s)).  Blood Alcohol level:  Lab Results  Component Value Date   ETH <10 02/14/2018   ETH <10 01/10/2018   Metabolic Disorder Labs: Lab Results  Component Value Date   HGBA1C 5.6 01/15/2018   MPG 114.02 01/15/2018   Lab Results  Component Value Date   PROLACTIN 44.7 (H) 01/15/2018    Lab Results  Component Value Date   CHOL 213 (H) 01/15/2018   TRIG 221 (H) 01/15/2018   HDL 60 01/15/2018   CHOLHDL 3.6 01/15/2018   VLDL 44 (H) 01/15/2018   LDLCALC 109 (H) 01/15/2018   Physical Findings: AIMS: Facial and Oral Movements Muscles of Facial Expression: None, normal Lips and Perioral Area: None, normal Jaw: None, normal Tongue: None, normal,Extremity Movements Upper (arms, wrists, hands, fingers): None, normal Lower (legs, knees, ankles, toes): None, normal, Trunk Movements Neck, shoulders, hips: None, normal, Overall Severity Severity of abnormal movements (highest score from questions above): None, normal Incapacitation due to abnormal movements: None, normal Curtis's awareness  of abnormal movements (rate only Curtis's report): No Awareness, Dental Status Current problems with teeth and/or dentures?: No Does Curtis usually wear dentures?: No  CIWA:  CIWA-Ar Total: 0 COWS:  COWS Total Score: 0  Musculoskeletal: Strength & Muscle Tone: within normal limits Gait & Station: normal Curtis leans: N/A  Psychiatric Specialty Exam: Physical Exam  Nursing note and vitals reviewed. Constitutional: He appears well-developed.  Neurological: He is alert.  Psychiatric: He has a normal mood and affect.    Review of Systems  Psychiatric/Behavioral: Negative for depression and suicidal ideas. The Curtis is not nervous/anxious and does not have insomnia.   All other systems reviewed and are negative.   Blood pressure 120/79, pulse 88, temperature 97.9 F (36.6 C), temperature source Oral, resp. rate 17, height 6' (1.829 m), weight 89.4 kg (197 lb), SpO2 100 %.Body mass index is 26.72 kg/m.  General Appearance: Casual and Guarded malodorous  Eye Contact:  Fair  Speech:  Clear and Coherent  Volume:  Normal  Mood:  Euthymic  Affect:  Flat  Thought Process:  Coherent  Orientation:  Other:  person and place  Thought Content:  Hallucinations: denies, however  appears to be responding to internal stimuli and Paranoid Ideation  Suicidal Thoughts:  No  Homicidal Thoughts:  No  Memory:  Immediate;   Fair Recent;   Fair Remote;   Fair  Judgement:  Fair  Insight:  Fair  Psychomotor Activity:  Normal  Concentration:  Concentration: Fair  Recall:  [employer]  Fund of Knowledge:  Fair  Language:  Fair  Akathisia:  No  Handed:  Right  AIMS (if indicated):     Assets:  Communication Skills Desire for Improvement Resilience Social Support  ADL's:  Intact  Cognition:  WNL  Sleep:  Number of Hours: 6.25   Treatment Plan Summary: Daily contact with Curtis to assess and evaluate symptoms and progress in treatment and Medication management   - Continue inpatient hospitalization.  - Will continue today 02/23/2018 plan as below except where it is noted.  Mood control:     - Continue fluphenazine (Prolixin) 5 mg po for 1 week after second injection.     - Continue Fluphenazine (Prolixin) injectable 25 mg/ml IM was given 02/15/2018.      - Continue Fluphenazine 50 mg IM to be given on 02/22/2018 &  50 mg IM every 28 days from 02-22-18.   Insomnia.     - Continue Trazodone 100 mg po Q hs.  EPS:      - Continue Cogentin 5 mg po bid prn.  Anxiety:     - Continue Vistaril 25 mg po tid prn.  CSW to continue working on discharge disposition Curtis encouraged to attend daily group sessions with active and engaged participation throughout the milieu  TB/PPD placed 02/19/2018; read on 02/22/2108- negative.  Curtis has been accepted to [facility] in [place], [place].  Armandina StammerAgnes Nwoko, NP, PMHNP, FNP-BC. 02/23/2018, 2:39 PMPatient ID: Erenest RasherNadon Davenport, male   DOB: Aug 31, 1990, 27 y.o.

## 2018-02-23 NOTE — Progress Notes (Signed)
  D: Pt observed walking in the hall smiling. However, pt doesn't stop walking long enough to have a full conversation with the Clinical research associatewriter. When asked about his day pt states, "alright". Pt has no questions or concerns.   A:  Support and encouragement was offered. 15 min checks continued for safety.  R: Pt remains safe.

## 2018-02-23 NOTE — Progress Notes (Signed)
D: Patient was moved into another room. After transferring the patient to another bed, housekeeping found prolixin 5mg  under a towel in his bedroom. He is prescribed this medication. It appears he is cheeking his medication.  A: Will monitor for further cheeking of medications.

## 2018-02-23 NOTE — [facility] Group Notes (Signed)
[facility] LCSW Group Therapy Note  02/23/2018  11:00-11:40AM  Type of Therapy and Topic:  Group Therapy:  Building Supports  Participation Level:  Did not attend  Description of Group:  Patients in this group were introduced to the idea of adding a variety of healthy supports to address the various needs in their lives.  Different types of support were defined and described, and patients were asked to act out what each type could be.  Patients discussed what additional healthy supports could be helpful in their recovery and wellness after discharge in order to prevent future hospitalizations.   An emphasis was placed on following up with the discharge plan when they leave the hospital in order to continue becoming healthier and happier.  Therapeutic Goals: 1)  demonstrate the importance of adding supports  2)  discuss 4 definitions of support  3)  identify the patient's current level of healthy support and   4)  elicit commitments to add one healthy support   Summary of Patient Progress:  Did not attend  Therapeutic Modalities:   Motivational Interviewing Brief Solution-Focused Therapy  Shellia CleverlyStephanie N Shallyn Constancio

## 2018-02-24 ENCOUNTER — Encounter (HOSPITAL_COMMUNITY): Payer: Self-pay | Admitting: Behavioral Health

## 2018-02-24 MED ORDER — HYDROXYZINE HCL 25 MG PO TABS: 25.0000 mg | ORAL_TABLET | Freq: Three times a day (TID) | ORAL | 0 refills | Status: AC | PRN

## 2018-02-24 MED ORDER — FLUPHENAZINE HCL 5 MG PO TABS: 5.0000 mg | ORAL_TABLET | ORAL | 0 refills | Status: AC

## 2018-02-24 MED ORDER — FLUPHENAZINE DECANOATE 25 MG/ML IJ SOLN: 50.0000 mg | INTRAMUSCULAR | 0 refills | Status: AC

## 2018-02-24 MED ORDER — FLUPHENAZINE HCL 5 MG PO TABS: 5.0000 mg | ORAL_TABLET | ORAL | 0 refills | Status: DC

## 2018-02-24 MED ORDER — FLUPHENAZINE DECANOATE 25 MG/ML IJ SOLN: 50.0000 mg | INTRAMUSCULAR | Status: DC

## 2018-02-24 MED ORDER — BENZTROPINE MESYLATE 0.5 MG PO TABS: 0.5000 mg | ORAL_TABLET | Freq: Two times a day (BID) | ORAL | 0 refills | Status: AC | PRN

## 2018-02-24 NOTE — BHH Suicide Risk Assessment (Signed)
[facility] Discharge Suicide Risk Assessment   Principal Problem: Schizophrenia, paranoid, chronic (HCC) Discharge Diagnoses:  Patient Active Problem List   Diagnosis Date Noted  . Schizophrenia, paranoid (HCC) [F20.0] 02/14/2018  . Schizophrenia, paranoid, chronic (HCC) [F20.0] 01/13/2018  . Psychosis (HCC) [F29]   . Paranoid schizophrenia (HCC) [F20.0] 01/10/2018  . Schizophrenia, undifferentiated (HCC) [F20.3]     Total Time spent with patient: 30 minutes  Musculoskeletal: Strength & Muscle Tone: within normal limits Gait & Station: normal Patient leans: N/A  Psychiatric Specialty Exam: Review of Systems  Constitutional: Negative for chills and fever.  Respiratory: Negative for cough and shortness of breath.   Cardiovascular: Negative for chest pain.  Gastrointestinal: Negative for abdominal pain, heartburn, nausea and vomiting.  Psychiatric/Behavioral: Negative for depression, hallucinations and suicidal ideas. The patient is not nervous/anxious and does not have insomnia.     Blood pressure 125/74, pulse 88, temperature 98.9 F (37.2 C), resp. rate 20, height 6' (1.829 m), weight 89.4 kg (197 lb), SpO2 100 %.Body mass index is 26.72 kg/m.  General Appearance: Casual and Fairly Groomed  Patent attorney::  Good  Speech:  Clear and Coherent and Normal Rate  Volume:  Normal  Mood:  Euthymic  Affect:  Appropriate and Congruent  Thought Process:  Coherent and Goal Directed  Orientation:  Full (Time, Place, and Person)  Thought Content:  Delusions and Hallucinations: Auditory  Suicidal Thoughts:  No  Homicidal Thoughts:  No  Memory:  Immediate;   Fair Recent;   Fair Remote;   Fair  Judgement:  Poor  Insight:  Lacking  Psychomotor Activity:  Normal  Concentration:  Fair  Recall:  [employer] of Knowledge:Fair  Language: Fair  Akathisia:  No  Handed:    AIMS (if indicated):     Assets:  Resilience Social Support  Sleep:  Number of Hours: 5.25  Cognition: WNL  ADL's:   Intact   Mental Status Per Nursing Assessment::   On Admission:  Suicidal ideation indicated by others  Demographic Factors:  Male, Adolescent or young adult, Low socioeconomic status, Living alone and Unemployed  Loss Factors: NA  Historical Factors: Family history of mental illness or substance abuse and Impulsivity  Risk Reduction Factors:   Positive social support, Positive therapeutic relationship and Positive coping skills or problem solving skills  Continued Clinical Symptoms:  Schizophrenia:   Less than 91 years old Paranoid or undifferentiated type  Cognitive Features That Contribute To Risk:  None    Suicide Risk:  Minimal: No identifiable suicidal ideation.  Patients presenting with no risk factors but with morbid ruminations; may be classified as minimal risk based on the severity of the depressive symptoms  Follow-up Information    [facility] Follow up on 02/24/2018.   Why:  You have been accepted into this group home for Monday, 8/5 per Ethelene Browns in admissions. The group home driver will pick you up from hospital on 8/5. Thank you.  Contact information: [address] [place], [place] [address] Phone: [phone] Ethelene Browns) Fax: [phone]       [facility] Follow up.   Specialty:  Behavioral Health Why:  appt made but deleted. please call Shanda Bumps monday morning to get appt for pt Contact information: [address] [place] [address] [phone]         Subjective Data:  Curtis Davenport is a 27 y/o M with history of schizophrenia who was admitted from [facility] on IVC with worsening psychosis including depression, SI with plan  to lay down on train tracks, disorganization, paranoia, and hallucinations. He was started on prolixin and received prolixin decanoate 25mg  IM on 02/15/18. He had gradual improvement of his presenting symptoms, and he became polite, pleasant, cooperative, and engaged on the unit. Pt remained somewhat vague and  circumstantial overall, but this appears to be his baseline.   Today upon evaluation, pt shares, "I'm doing good." When asked about circumstances regarding admission, pt is vague and circumstantial but he does have some insight and shares that he has been experiencing "hearing wearwolves." He denies current SI/HI/AH/VH. He is sleeping well. His appetite is good. He is tolerating his medications well. He is in agreement to continue his current medication regimen without changes, including to continue the oral form of prolixin in addition to following up with his outpatient provider for prolixin decanoate injections. SW team has been working for group home placement, which has been scheduled for today, and pt was in agreement with plan to transition to group home and discharge today. He was able to engage in safety planning including [facility] or contact emergency services if he feels unable to maintain his own safety or the safety of others. Pt had no further questions, comments, or concerns.   Plan Of Care/Follow-up recommendations:   -Discharge to outpatient level of care  -Mood control:     - Continue fluphenazine (Prolixin) 5 mg po for 1 week after second injection.     - Continue Fluphenazine (Prolixin) injectable 25 mg/ml IM was given 02/15/2018.      - Continue Fluphenazine 50 mg IM to be given on 02/22/2018 &  50 mg IM every 28 days from 02-22-18.   -Insomnia.     - Continue Trazodone 100 mg po Q hs.  -EPS:      - Continue Cogentin 5 mg po bid prn EPS.  -Anxiety:     - Continue Vistaril 25 mg po tid prn anxiety. milieu  -Disposition  -TB/PPD placed 02/19/2018; read on 02/22/2108- negative.   -[facility] [place], [place].  Activity:  as tolerated Diet:  normal Tests:  NA Other:  see above for DC plan  Micheal Likenshristopher T Abdurahman Rugg, MD 02/24/2018, 9:44 AM

## 2018-02-24 NOTE — Discharge Summary (Addendum)
Physician Discharge Summary Note  Patient:  Curtis Davenport is an 27 y.o., male MRN:  [id number] DOB:  24-Apr-1991 Patient phone:  [phone] (home)  Patient address:   [address] [place] [address],  Total Time spent with patient: 30 minutes  Date of Admission:  02/14/2018 Date of Discharge: 02/24/2018  Reason for Admission:   disorganized thought process and appearing to be responding to internal stimuli     Principal Problem: Schizophrenia, paranoid, chronic [facility]) Discharge Diagnoses: Patient Active Problem List   Diagnosis Date Noted  . Schizophrenia, paranoid (HCC) [F20.0] 02/14/2018  . Schizophrenia, paranoid, chronic (HCC) [F20.0] 01/13/2018  . Psychosis (HCC) [F29]   . Paranoid schizophrenia (HCC) [F20.0] 01/10/2018  . Schizophrenia, undifferentiated (HCC) [F20.3]     Past Psychiatric History: dx of schizophrenia - about 5 previous inpt stays with last admission about 1 month ago - Current outpatient at [facility],  - Denies hx of suicide attempt    Past Medical History:  Past Medical History:  Diagnosis Date  . Hallucinations   . PTSD (post-traumatic stress disorder)   . Schizophrenia (HCC)    History reviewed. No pertinent surgical history. Family History: History reviewed. No pertinent family history. Family Psychiatric  History: Pt is not aware of family psychiatric history.   Social History:  Social History   Substance and Sexual Activity  Alcohol Use Yes     Social History   Substance and Sexual Activity  Drug Use No    Social History   Socioeconomic History  . Marital status: Single    Spouse name: Not on file  . Number of children: Not on file  . Years of education: Not on file  . Highest education level: Not on file  Occupational History  . Not on file  Social Needs  . Financial resource strain: Not on file  . Food insecurity:    Worry: Not on file    Inability: Not on file  . Transportation needs:     Medical: Not on file    Non-medical: Not on file  Tobacco Use  . Smoking status: Current Some Day Smoker    Packs/day: 0.50  . Smokeless tobacco: Never Used  Substance and Sexual Activity  . Alcohol use: Yes  . Drug use: No  . Sexual activity: Yes  Lifestyle  . Physical activity:    Days per week: Not on file    Minutes per session: Not on file  . Stress: Not on file  Relationships  . Social connections:    Talks on phone: Not on file    Gets together: Not on file    Attends religious service: Not on file    Active member of club or organization: Not on file    Attends meetings of clubs or organizations: Not on file    Relationship status: Not on file  Other Topics Concern  . Not on file  Social History Narrative  . Not on file    Hospital Course:  27 year old male, single , reports he is currently homeless. He  was admitted under commitment reporting that he has a history of schizophrenia and had threatened suicide by laying down on train tracks. In ED he presented with disorganized thought process and appearing to be responding to internal stimuli  Patient is known to [facility] and was recently admitted to inpatient unit from 6/25 through 7/2 . At the time was admitted for worsening psychosis, responding to internal stimuli, and poor self  care. Was diagnosed with Schizophrenia, was discharged on Prolixin 5 mgrs BID. Patient presents as a vague historian , currently denies depression or having had suicidal ideations. Denies depression.  States " the police brought me because it is their duty to protect all of [place]". Endorses auditory hallucinations ( see below). States he had been taking medications prior to admission. Denies drug or alcohol abuse and admission UDS and BAL are negative.  Brodee was started on medication regimen for presenting symptoms. He was medicated & discharged on;  Mood control:     - Continue fluphenazine (Prolixin) 5 mg po for 14 days following discharge then  discontinue.      - Continue Fluphenazine (Prolixin) injectable 25 mg/ml IM was given 02/15/2018.      - Continue Fluphenazine 50 mg IM to be given on 02/22/2018 &  50 mg IM every 28 days from 02-22-18 (03/21/2018 as the 28th day falls on a Saturday and outpatient provider may not be available to give for administration).   EPS:      - Continue Cogentin 5 mg po bid prn.  Anxiety:     - Continue Vistaril 25 mg po tid prn.  Patient to resume home medications as noted below.   Patient has been adherent with treatment recommendations. Patient tolerated the medications without any reported side effects are adverse reactions.  Patient was enrolled & participated in the group counseling sessions being offerred & held on this unit. Patient learned coping skills.  Javad is seen today by the attending psychiatrist for discharge. Patient denies any delusions, no hallucinations or other psychotic process. Patient denies active or passive suicidal thoughts. No thoughts of violence.  Endorses overall improvement in mood emotional state.    Nursing staff reports that patient has been appropriate on the unit. Patient has been interacting well with peers. No behavioral issues. Patient has not voiced any suicidal thoughts. Prior to discharge. Patient was discussed at the treatment team meeting this morning. Team members feels that patient is back to his baseline level of functioning. Team agrees with plan to discharge patient today. Patient was provided with all follow-up information to resume mental health treatment following discharge as noted below (Pt accepted to [facility]). Delores was provided with a prescription for his [facility] discharge medications.  Patient left [facility] with all personal belongings in no apparent distress. Transportation per patient/ family arrangement.     Labs: Reviewed and noted as below  Physical Findings: AIMS: Facial and Oral Movements Muscles of Facial Expression: None,  normal Lips and Perioral Area: None, normal Jaw: None, normal Tongue: None, normal,Extremity Movements Upper (arms, wrists, hands, fingers): None, normal Lower (legs, knees, ankles, toes): None, normal, Trunk Movements Neck, shoulders, hips: None, normal, Overall Severity Severity of abnormal movements (highest score from questions above): None, normal Incapacitation due to abnormal movements: None, normal Patient's awareness of abnormal movements (rate only patient's report): No Awareness, Dental Status Current problems with teeth and/or dentures?: No Does patient usually wear dentures?: No  CIWA:  CIWA-Ar Total: 0 COWS:  COWS Total Score: 0  Musculoskeletal: Strength & Muscle Tone: within normal limits Gait & Station: normal Patient leans: N/A  Psychiatric Specialty Exam: SEE SRA BY MD  Physical Exam  Nursing note and vitals reviewed. Constitutional: He is oriented to person, place, and time.  Neurological: He is alert and oriented to person, place, and time.    Review of Systems  Psychiatric/Behavioral: Negative for hallucinations, memory loss, substance abuse and suicidal  ideas. Depression: stable. Nervous/anxious: stable. Insomnia: stable.   All other systems reviewed and are negative.   Blood pressure 125/74, pulse 88, temperature 98.9 F (37.2 C), resp. rate 20, height 6' (1.829 m), weight 89.4 kg (197 lb), SpO2 100 %.Body mass index is 26.72 kg/m.   Have you used any form of tobacco in the last 30 days? (Cigarettes, Smokeless Tobacco, Cigars, and/or Pipes): Yes  Has this patient used any form of tobacco in the last 30 days? (Cigarettes, Smokeless Tobacco, Cigars, and/or Pipes)  Yes, A prescription for an FDA-approved tobacco cessation medication was offered at discharge and the patient refused  Blood Alcohol level:  Lab Results  Component Value Date   [facility] <10 02/14/2018   ETH <10 01/10/2018    Metabolic Disorder Labs:  Lab Results  Component Value Date   HGBA1C  5.6 01/15/2018   MPG 114.02 01/15/2018   Lab Results  Component Value Date   PROLACTIN 44.7 (H) 01/15/2018   Lab Results  Component Value Date   CHOL 213 (H) 01/15/2018   TRIG 221 (H) 01/15/2018   HDL 60 01/15/2018   CHOLHDL 3.6 01/15/2018   VLDL 44 (H) 01/15/2018   LDLCALC 109 (H) 01/15/2018    See Psychiatric Specialty Exam and Suicide Risk Assessment completed by Attending Physician prior to discharge.  Discharge destination:  Other:  Pt accepted to [facility]  Is patient on multiple antipsychotic therapies at discharge:  No   Has Patient had three or more failed trials of antipsychotic monotherapy by history:  No  Recommended Plan for Multiple Antipsychotic Therapies: NA   Allergies as of 02/24/2018      Reactions   Shellfish Allergy Hives, Swelling      Medication List    STOP taking these medications   albuterol 108 (90 Base) MCG/ACT inhaler Commonly known as:  PROVENTIL HFA;VENTOLIN HFA   fluticasone 50 MCG/ACT nasal spray Commonly known as:  FLONASE     TAKE these medications     Indication  benztropine 0.5 MG tablet Commonly known as:  COGENTIN Take 1 tablet (0.5 mg total) by mouth 2 (two) times daily as needed for tremors. What changed:    when to take this  reasons to take this  additional instructions  Indication:  Extrapyramidal Reaction caused by Medications   cetirizine 5 MG tablet Commonly known as:  ZYRTEC Take 5 mg by mouth daily.  Indication:  allergies   fluPHENAZine 5 MG tablet Commonly known as:  PROLIXIN Take 1 tablet (5 mg total) by mouth 2 (two) times daily in the am and at bedtime.. Continue to take this medication by mouth twice a day for 14 days after discharge and then discontinue. What changed:    when to take this  additional instructions  Indication:  Mood control   fluPHENAZine decanoate 25 MG/ML injection Commonly known as:  PROLIXIN Inject 2 mLs (50 mg total) into the muscle every 28  (twenty-eight) days. Start taking on:  03/21/2018  Indication:  Schizophrenia   hydrOXYzine 25 MG tablet Commonly known as:  ATARAX/VISTARIL Take 1 tablet (25 mg total) by mouth 3 (three) times daily as needed for anxiety. What changed:    medication strength  how much to take  when to take this  Indication:  Feeling Anxious   lidocaine 5 % Commonly known as:  LIDODERM Place 1 patch onto the skin daily. Remove & Discard patch within 12 hours or as directed by MD: For pain  Indication:  Pain   traZODone 100 MG tablet Commonly known as:  DESYREL Take 1 tablet (100 mg total) by mouth at bedtime as needed for sleep.  Indication:  Trouble Sleeping      Follow-up Information    [facility] on 02[facility]24/2018.   Why:  You have been accepted into this group home for Monday, 8/5 per Ethelene BrownsAnthony in admissions. The group home driver will pick you up from hospital on 8/5. Thank you.  Contact information: [address] [place], [place] [address] Phone: [phone] Ethelene Browns(ANTHONY) Fax: [phone]       [facility] Follow up on 02/27/2018.   Specialty:  Behavioral Health Why:  Hospital follow-up on Thursday, 8/8 at 8:15AM. Please bring the following if you have them: Photo ID, social security card, medicaid card, and hospital discharge paperwork. Thank you.  Contact informationElpidio Eric: [address] [place] [address] [phone]           Follow-up recommendations: Follow up with your outpatient provided for any medical issues. Activity & diet as recommended by your primary care provider.  Comments:  Patient is instructed prior to discharge to: Take all medications as prescribed by his/her mental healthcare provider. Report any adverse effects and or reactions from the medicines to his/her outpatient provider promptly. Patient has been instructed & cautioned: To not engage in alcohol and or illegal drug use while on prescription medicines. In the event of worsening  symptoms, patient is instructed to call the crisis hotline, 911 and or go to the nearest ED for appropriate evaluation and treatment of symptoms. To follow-up with his/her primary care provider for your other medical issues, concerns and or health care needs.  Signed: Denzil MagnusonLaShunda Thomas, NP 02/24/2018, 11:49 AM   Patient seen, Suicide Assessment Completed.  Disposition Plan Reviewed

## 2018-02-24 NOTE — Progress Notes (Signed)
  [facility] :  Will you be returning to the same living situation after discharge:  No. [facility].  At discharge, do you have transportation home?: Yes,  [facility] will pick up pt between 1pm-3pm per Ethelene BrownsAnthony. Do you have the ability to pay for your medications: Yes,  [facility]  Release of information consent forms completed and submitted to medical records by CSW.   Patient to Follow up at: Follow-up Information    [facility] on 02/24/2018.   Why:  You have been accepted into this group home for Monday, 8/5 per Ethelene BrownsAnthony in admissions. The group home driver will pick you up from hospital on 8/5. Thank you.  Contact information: [address] [place], [place] [address] Phone: [phone] Ethelene Browns(ANTHONY) Fax: [phone]       [facility] Follow up on 02/27/2018.   Specialty:  Behavioral Health Why:  Hospital follow-up on Thursday, 8/8 at 8:15AM. Please bring the following if you have them: Photo ID, social security card, medicaid card, and hospital discharge paperwork. Thank you.  Contact information: [address] [place] [address] [phone]           Next level of care provider has access to [facility] Link:no  Safety Planning and Suicide Prevention discussed: Yes,  SPE completed with pt's mother and with pt. SPI pamphlet and Mobile Crisis information provided   Have you used any form of tobacco in the last 30 days? (Cigarettes, Smokeless Tobacco, Cigars, and/or Pipes): Yes  Has patient been referred to the Quitline?: Patient refused referral  Patient has been referred for addiction treatment: N/A  Rona RavensHeather S Britania Shreeve, LCSW 02/24/2018, 10:15 AM

## 2018-02-24 NOTE — Plan of Care (Signed)
Pt did not attend recreation therapy group sessions.   Nikeria Kalman, LRT/CTRS 

## 2018-02-24 NOTE — Progress Notes (Signed)
CSW spoke with Ethelene BrownsAnthony from group home. Driver will come betweeen 1pm-3pm to pick up pt.  Yared Susan S. Alan RipperHolloway, MSW, LCSW Clinical Social Worker 02/24/2018 8:29 AM

## 2018-02-24 NOTE — Progress Notes (Signed)
Recreation Therapy Notes  INPATIENT RECREATION TR PLAN  Patient Details Name: Filiberto Wamble MRN: [id number] DOB: 04-Oct-1990 Today's Date: 02/24/2018  Rec Therapy Plan Is patient appropriate for Therapeutic Recreation?: Yes Treatment times per week: about 3 days  Estimated Length of Stay: 5-7 days TR Treatment/Interventions: Group participation (Comment)  Discharge Criteria Pt will be discharged from therapy if:: Discharged Treatment plan/goals/alternatives discussed and agreed upon by:: Patient/family  Discharge Summary Short term goals set: See patient care plan Short term goals met: Not met Reason goals not met: Pt did not attend groups. Therapeutic equipment acquired: N/A Reason patient discharged from therapy: Discharge from hospital Pt/family agrees with progress & goals achieved: Yes Date patient discharged from therapy: 02/24/18   Victorino Sparrow, LRT/CTRS  Ria Comment, Saiya Crist A 02/24/2018, 11:41 AM

## 2018-02-24 NOTE — Progress Notes (Signed)
Patient discharged to lobby. Patient was stable and appreciative at that time. All papers, samples and prescriptions were given and valuables returned. Verbal understanding expressed. Denies SI/HI and A/VH. Patient given opportunity to express concerns and ask questions.  

## 2020-06-27 ENCOUNTER — Ambulatory Visit (HOSPITAL_COMMUNITY)
Admission: EM | Admit: 2020-06-27 | Discharge: 2020-06-27 | Disposition: A | Discharge status: Home / Self Care | Payer: Medicaid Other | Attending: Registered Nurse | Admitting: Registered Nurse

## 2020-06-27 ENCOUNTER — Encounter (HOSPITAL_COMMUNITY): Payer: Self-pay | Admitting: Registered Nurse

## 2020-06-27 ENCOUNTER — Other Ambulatory Visit: Payer: Self-pay

## 2020-06-27 DIAGNOSIS — F200 Paranoid schizophrenia: Secondary | ICD-10-CM

## 2020-06-27 DIAGNOSIS — F419 Anxiety disorder, unspecified: Secondary | ICD-10-CM | POA: Insufficient documentation

## 2020-06-27 DIAGNOSIS — F411 Generalized anxiety disorder: Secondary | ICD-10-CM

## 2020-06-27 NOTE — Discharge Instructions (Signed)
[facility] will follow up with you at your home tomorrow 06/29/2011 [phone]:  Office [phone]:  Crisis 24 hour Alethia Berthold (Child psychotherapist)

## 2020-06-27 NOTE — BHH Counselor (Signed)
Patient guardian Curtis Davenport returned TTS call and was notified of patient urgent care visit and disposition. Archie Patten states that she is planning on visiting patient tomorrow.
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
## 2020-07-13 ENCOUNTER — Telehealth (HOSPITAL_COMMUNITY): Payer: Self-pay | Admitting: General Practice

## 2020-07-13 NOTE — Telephone Encounter (Signed)
Care Management - Follow Up [facility] Discharges   Writer made contact with Morrie Sheldon) on the the patient's [facility] Team.  Patient does not have a phone.  The phone number listed is his mother's telephone.    Per the ACTT Team the patient is currently receiving services.

## 2020-08-05 ENCOUNTER — Other Ambulatory Visit: Payer: Self-pay

## 2020-08-05 ENCOUNTER — Encounter (HOSPITAL_COMMUNITY): Payer: Self-pay

## 2020-08-05 ENCOUNTER — Emergency Department (HOSPITAL_COMMUNITY)
Admission: EM | Admit: 2020-08-05 | Discharge: 2020-08-05 | Disposition: A | Discharge status: Home / Self Care | Payer: Medicaid Other | Attending: Emergency Medicine | Admitting: Emergency Medicine

## 2020-08-05 DIAGNOSIS — Z5321 Procedure and treatment not carried out due to patient leaving prior to being seen by health care provider: Secondary | ICD-10-CM | POA: Insufficient documentation

## 2020-08-05 DIAGNOSIS — H5713 Ocular pain, bilateral: Secondary | ICD-10-CM | POA: Diagnosis present

## 2020-08-05 HISTORY — DX: Unspecified asthma, uncomplicated: J45.909

## 2020-08-05 NOTE — ED Triage Notes (Signed)
Pt BIB EMS from home. Pt reports bilateral eye pain with intermittent blurred vision x2 weeks. Pt reports that his eyes feel "scratchy".

## 2020-10-06 ENCOUNTER — Emergency Department (HOSPITAL_COMMUNITY)
Admission: EM | Admit: 2020-10-06 | Discharge: 2020-10-06 | Disposition: A | Discharge status: Home / Self Care | Payer: Medicaid Other | Attending: Emergency Medicine | Admitting: Emergency Medicine

## 2020-10-06 ENCOUNTER — Encounter (HOSPITAL_COMMUNITY): Payer: Self-pay | Admitting: *Deleted

## 2020-10-06 DIAGNOSIS — Z5321 Procedure and treatment not carried out due to patient leaving prior to being seen by health care provider: Secondary | ICD-10-CM | POA: Insufficient documentation

## 2020-10-06 DIAGNOSIS — R55 Syncope and collapse: Secondary | ICD-10-CM | POA: Insufficient documentation

## 2020-10-06 DIAGNOSIS — J45909 Unspecified asthma, uncomplicated: Secondary | ICD-10-CM | POA: Diagnosis not present

## 2020-10-06 NOTE — ED Notes (Signed)
Screeners stated they seen patient leave

## 2020-10-06 NOTE — ED Triage Notes (Addendum)
Per EMS, pt reports 2 syncopal episodes this morning. He lives alone, is not sure how long he passes out. No injuries. He said the first time he had episode he was standing, the second time he was sitting down. Pt reports syncopal episodes started a month ago. History of schizophrenia and asthma.   BP 118/62 HR 66 O2 98% CBG 101 Temp 98.1

## 2022-01-28 ENCOUNTER — Encounter (HOSPITAL_COMMUNITY): Payer: Self-pay | Admitting: Emergency Medicine

## 2022-01-28 ENCOUNTER — Other Ambulatory Visit: Payer: Self-pay

## 2022-01-28 ENCOUNTER — Emergency Department (HOSPITAL_COMMUNITY): Payer: Medicaid Other

## 2022-01-28 ENCOUNTER — Emergency Department (HOSPITAL_COMMUNITY)
Admission: EM | Admit: 2022-01-28 | Discharge: 2022-01-28 | Discharge status: Against Medical Advice | Payer: Medicaid Other | Attending: Emergency Medicine | Admitting: Emergency Medicine

## 2022-01-28 DIAGNOSIS — R059 Cough, unspecified: Secondary | ICD-10-CM | POA: Diagnosis not present

## 2022-01-28 DIAGNOSIS — R0602 Shortness of breath: Secondary | ICD-10-CM | POA: Diagnosis present

## 2022-01-28 DIAGNOSIS — Z5321 Procedure and treatment not carried out due to patient leaving prior to being seen by health care provider: Secondary | ICD-10-CM | POA: Insufficient documentation

## 2022-01-28 LAB — CBC WITH DIFFERENTIAL/PLATELET
Abs Immature Granulocytes: 0.02 10*3/uL (ref 0.00–0.07)
Basophils Absolute: 0 10*3/uL (ref 0.0–0.1)
Basophils Relative: 0 %
Eosinophils Absolute: 0.3 10*3/uL (ref 0.0–0.5)
Eosinophils Relative: 5 %
HCT: 44.1 % (ref 39.0–52.0)
Hemoglobin: 14.8 g/dL (ref 13.0–17.0)
Immature Granulocytes: 0 %
Lymphocytes Relative: 44 %
Lymphs Abs: 3.1 10*3/uL (ref 0.7–4.0)
MCH: 28 pg (ref 26.0–34.0)
MCHC: 33.6 g/dL (ref 30.0–36.0)
MCV: 83.4 fL (ref 80.0–100.0)
Monocytes Absolute: 0.5 10*3/uL (ref 0.1–1.0)
Monocytes Relative: 7 %
Neutro Abs: 3.1 10*3/uL (ref 1.7–7.7)
Neutrophils Relative %: 44 %
Platelets: 232 10*3/uL (ref 150–400)
RBC: 5.29 MIL/uL (ref 4.22–5.81)
RDW: 15 % (ref 11.5–15.5)
WBC: 7.1 10*3/uL (ref 4.0–10.5)
nRBC: 0 % (ref 0.0–0.2)

## 2022-01-28 LAB — BASIC METABOLIC PANEL
Anion gap: 14 (ref 5–15)
BUN: 11 mg/dL (ref 6–20)
CO2: 22 mmol/L (ref 22–32)
Calcium: 10.3 mg/dL (ref 8.9–10.3)
Chloride: 103 mmol/L (ref 98–111)
Creatinine, Ser: 1.04 mg/dL (ref 0.61–1.24)
GFR, Estimated: >60 mL/min (ref >60)
Glucose, Bld: 100 mg/dL — ABNORMAL HIGH (ref 70–99)
Potassium: 4.2 mmol/L (ref 3.5–5.1)
Sodium: 139 mmol/L (ref 135–145)

## 2022-01-28 NOTE — ED Notes (Signed)
PT called again for vitals with no response

## 2022-01-28 NOTE — ED Triage Notes (Signed)
Patient arrived with EMS from a local motel reports intermittent /chronic SOB for 2 months with occasional dry cough , no fever or chills .

## 2022-01-28 NOTE — ED Notes (Signed)
PT called X3 for vitals no response. Will attempt again shortly
